# Patient Record
Sex: Male | Born: 1965
Health system: Southern US, Community
[De-identification: ages and names within clinical notes are randomized; demographics above are authoritative.]

## PROBLEM LIST (undated history)

## (undated) DIAGNOSIS — G4730 Sleep apnea, unspecified: Secondary | ICD-10-CM

## (undated) DIAGNOSIS — I10 Essential (primary) hypertension: Secondary | ICD-10-CM

## (undated) HISTORY — PX: EYE SURGERY: SHX253

---

## 2007-03-20 ENCOUNTER — Emergency Department (HOSPITAL_COMMUNITY): Admission: EM | Admit: 2007-03-20 | Discharge: 2007-03-20 | Payer: Self-pay | Admitting: Emergency Medicine

## 2007-03-22 ENCOUNTER — Emergency Department (HOSPITAL_COMMUNITY): Admission: EM | Admit: 2007-03-22 | Discharge: 2007-03-23 | Payer: Self-pay | Admitting: Emergency Medicine

## 2008-05-30 ENCOUNTER — Emergency Department (HOSPITAL_COMMUNITY): Admission: EM | Admit: 2008-05-30 | Discharge: 2008-05-30 | Payer: Self-pay | Admitting: Emergency Medicine

## 2008-07-13 ENCOUNTER — Emergency Department (HOSPITAL_COMMUNITY): Admission: EM | Admit: 2008-07-13 | Discharge: 2008-07-14 | Payer: Self-pay | Admitting: Emergency Medicine

## 2010-04-01 ENCOUNTER — Emergency Department (HOSPITAL_COMMUNITY)
Admission: EM | Admit: 2010-04-01 | Discharge: 2010-04-01 | Payer: Self-pay | Source: Home / Self Care | Admitting: Family Medicine

## 2010-04-04 ENCOUNTER — Emergency Department (HOSPITAL_COMMUNITY)
Admission: EM | Admit: 2010-04-04 | Discharge: 2010-04-04 | Payer: Self-pay | Source: Home / Self Care | Admitting: Family Medicine

## 2010-04-07 LAB — CULTURE, ROUTINE-ABSCESS

## 2010-06-11 ENCOUNTER — Emergency Department (HOSPITAL_COMMUNITY)
Admission: EM | Admit: 2010-06-11 | Discharge: 2010-06-11 | Disposition: A | Discharge status: Home / Self Care | Payer: Worker's Compensation | Attending: Emergency Medicine | Admitting: Emergency Medicine

## 2010-06-11 DIAGNOSIS — I10 Essential (primary) hypertension: Secondary | ICD-10-CM | POA: Insufficient documentation

## 2010-06-11 DIAGNOSIS — Y990 Civilian activity done for income or pay: Secondary | ICD-10-CM | POA: Insufficient documentation

## 2010-06-11 DIAGNOSIS — T25229A Burn of second degree of unspecified foot, initial encounter: Secondary | ICD-10-CM | POA: Insufficient documentation

## 2010-06-11 DIAGNOSIS — X12XXXA Contact with other hot fluids, initial encounter: Secondary | ICD-10-CM | POA: Insufficient documentation

## 2010-06-11 DIAGNOSIS — X131XXA Other contact with steam and other hot vapors, initial encounter: Secondary | ICD-10-CM | POA: Insufficient documentation

## 2010-06-14 ENCOUNTER — Emergency Department (HOSPITAL_COMMUNITY)
Admission: EM | Admit: 2010-06-14 | Discharge: 2010-06-14 | Disposition: A | Discharge status: Home / Self Care | Payer: Worker's Compensation | Attending: Emergency Medicine | Admitting: Emergency Medicine

## 2010-06-14 DIAGNOSIS — I10 Essential (primary) hypertension: Secondary | ICD-10-CM | POA: Insufficient documentation

## 2010-06-14 DIAGNOSIS — Z09 Encounter for follow-up examination after completed treatment for conditions other than malignant neoplasm: Secondary | ICD-10-CM | POA: Insufficient documentation

## 2010-06-14 DIAGNOSIS — X12XXXA Contact with other hot fluids, initial encounter: Secondary | ICD-10-CM | POA: Insufficient documentation

## 2010-06-14 DIAGNOSIS — Y990 Civilian activity done for income or pay: Secondary | ICD-10-CM | POA: Insufficient documentation

## 2010-06-14 DIAGNOSIS — T25219A Burn of second degree of unspecified ankle, initial encounter: Secondary | ICD-10-CM | POA: Insufficient documentation

## 2010-06-17 ENCOUNTER — Inpatient Hospital Stay (INDEPENDENT_AMBULATORY_CARE_PROVIDER_SITE_OTHER)
Admission: RE | Admit: 2010-06-17 | Discharge: 2010-06-17 | Disposition: A | Discharge status: Home / Self Care | Payer: Worker's Compensation | Source: Ambulatory Visit | Attending: Emergency Medicine | Admitting: Emergency Medicine

## 2010-06-17 DIAGNOSIS — I10 Essential (primary) hypertension: Secondary | ICD-10-CM

## 2010-06-17 DIAGNOSIS — T25019A Burn of unspecified degree of unspecified ankle, initial encounter: Secondary | ICD-10-CM

## 2010-06-30 LAB — URINALYSIS, ROUTINE W REFLEX MICROSCOPIC
Bilirubin Urine: NEGATIVE
Glucose, UA: NEGATIVE mg/dL
Hgb urine dipstick: NEGATIVE
Ketones, ur: NEGATIVE mg/dL
Nitrite: NEGATIVE
Protein, ur: NEGATIVE mg/dL
Specific Gravity, Urine: 1.02 (ref 1.005–1.030)
Urobilinogen, UA: 0.2 mg/dL (ref 0.0–1.0)
pH: 6 (ref 5.0–8.0)

## 2010-06-30 LAB — DIFFERENTIAL
Basophils Absolute: 0 10*3/uL (ref 0.0–0.1)
Basophils Relative: 1 % (ref 0–1)
Eosinophils Absolute: 0.1 10*3/uL (ref 0.0–0.7)
Eosinophils Relative: 3 % (ref 0–5)
Lymphocytes Relative: 40 % (ref 12–46)
Lymphs Abs: 2.1 10*3/uL (ref 0.7–4.0)
Monocytes Absolute: 0.5 10*3/uL (ref 0.1–1.0)
Monocytes Relative: 9 % (ref 3–12)
Neutro Abs: 2.6 10*3/uL (ref 1.7–7.7)
Neutrophils Relative %: 48 % (ref 43–77)

## 2010-06-30 LAB — CBC
HCT: 43.8 % (ref 39.0–52.0)
Hemoglobin: 15.2 g/dL (ref 13.0–17.0)
MCHC: 34.7 g/dL (ref 30.0–36.0)
MCV: 95 fL (ref 78.0–100.0)
Platelets: 245 10*3/uL (ref 150–400)
RBC: 4.61 MIL/uL (ref 4.22–5.81)
RDW: 13 % (ref 11.5–15.5)
WBC: 5.4 10*3/uL (ref 4.0–10.5)

## 2010-06-30 LAB — POCT I-STAT, CHEM 8
BUN: 9 mg/dL (ref 6–23)
Calcium, Ion: 1.15 mmol/L (ref 1.12–1.32)
Chloride: 101 mEq/L (ref 96–112)
Creatinine, Ser: 1.4 mg/dL (ref 0.4–1.5)
Glucose, Bld: 89 mg/dL (ref 70–99)
HCT: 45 % (ref 39.0–52.0)
Hemoglobin: 15.3 g/dL (ref 13.0–17.0)
Potassium: 3.5 mEq/L (ref 3.5–5.1)
Sodium: 141 mEq/L (ref 135–145)
TCO2: 31 mmol/L (ref 0–100)

## 2010-06-30 LAB — URINE MICROSCOPIC-ADD ON

## 2010-09-17 ENCOUNTER — Emergency Department (HOSPITAL_COMMUNITY)
Admission: EM | Admit: 2010-09-17 | Discharge: 2010-09-17 | Disposition: A | Discharge status: Home / Self Care | Payer: Self-pay | Attending: Emergency Medicine | Admitting: Emergency Medicine

## 2010-09-17 DIAGNOSIS — I10 Essential (primary) hypertension: Secondary | ICD-10-CM | POA: Insufficient documentation

## 2010-09-17 DIAGNOSIS — M79609 Pain in unspecified limb: Secondary | ICD-10-CM | POA: Insufficient documentation

## 2010-09-17 DIAGNOSIS — L02519 Cutaneous abscess of unspecified hand: Secondary | ICD-10-CM | POA: Insufficient documentation

## 2010-09-17 DIAGNOSIS — M65839 Other synovitis and tenosynovitis, unspecified forearm: Secondary | ICD-10-CM | POA: Insufficient documentation

## 2010-09-17 DIAGNOSIS — L03019 Cellulitis of unspecified finger: Secondary | ICD-10-CM | POA: Insufficient documentation

## 2010-09-17 LAB — GRAM STAIN

## 2010-09-17 LAB — CBC
HCT: 37.7 % — ABNORMAL LOW (ref 39.0–52.0)
Hemoglobin: 13.5 g/dL (ref 13.0–17.0)
MCH: 31.8 pg (ref 26.0–34.0)
MCHC: 35.8 g/dL (ref 30.0–36.0)
MCV: 88.9 fL (ref 78.0–100.0)
Platelets: 278 10*3/uL (ref 150–400)
RBC: 4.24 MIL/uL (ref 4.22–5.81)
RDW: 12.6 % (ref 11.5–15.5)
WBC: 9 10*3/uL (ref 4.0–10.5)

## 2010-09-17 LAB — DIFFERENTIAL
Basophils Absolute: 0 10*3/uL (ref 0.0–0.1)
Basophils Relative: 0 % (ref 0–1)
Eosinophils Absolute: 0.1 10*3/uL (ref 0.0–0.7)
Eosinophils Relative: 1 % (ref 0–5)
Lymphocytes Relative: 33 % (ref 12–46)
Lymphs Abs: 3 10*3/uL (ref 0.7–4.0)
Monocytes Absolute: 0.9 10*3/uL (ref 0.1–1.0)
Monocytes Relative: 10 % (ref 3–12)
Neutro Abs: 5.1 10*3/uL (ref 1.7–7.7)
Neutrophils Relative %: 56 % (ref 43–77)

## 2010-09-19 LAB — CULTURE, ROUTINE-ABSCESS

## 2010-09-20 NOTE — Op Note (Signed)
  NAME:  Xavier Norman, Xavier Norman NO.:  [id number]  MEDICAL RECORD NO.:  [id number]  LOCATION:  [facility]                         FACILITY:  [facility]  PHYSICIAN:  Johnette Abraham, MD    DATE OF BIRTH:  1966/02/12  DATE OF PROCEDURE:  09/17/2010 DATE OF DISCHARGE:                              OPERATIVE REPORT   PREOPERATIVE DIAGNOSIS:  Infection and early tenosynovitis of the right long finger.  POSTOPERATIVE DIAGNOSIS:  Infection and early tenosynovitis of the right long finger.  PROCEDURE:  Incision and drainage of right long finger.  Irrigation of the tendon sheath of the right long finger, packing with Iodoform gauze.  INDICATIONS:  [name] is a 45 year old black male who noticed approximately 3 days ago blister on the aspect of his right long finger. He applied Neosporin and a Band-Aid and since that time it has become progressively red, swollen, tender, now it hurts to bend his fingers having some pain radiating to his palm.  He was evaluated in the emergency department and the diagnosis of early tenosynovitis and abscess was made.  Consent was made for incision and drainage of the index finger.  All risks, benefits, and alternatives were discussed with the patient.  He agreed to proceed.  The patient received IV clindamycin in the emergency room.  PROCEDURE:  The patient was taken to the operating room, placed supine on the operating room table.  The general anesthesia was administered without difficulty.  Time-out was performed identifying the correct finger.  The hand was prepped and draped in the normal sterile fashion. The arm was elevated.  Tourniquet was used and placed to 250 mmHg.  An incision overlying the area of maximal fluctuance on the on the ulnar aspect of the long finger just overlying the middle phalanx was made.  A V-incision was made over the middle phalanx and onto the distal phalanx. Immediately upon incising the skin, there was some purulence.   This was sent for culture.  The wound was then enlarged and exploration was performed.  There seemed to be an abscess that tracked along the neurovascular bundle and central pulp of the digit along the mid and distal phalanx.  This was evacuated.  Skin and subcutaneous tissue was debrided.  The wound was then thoroughly irrigated.  The flexor tendon sheath was opened milking from a proximal to distal direction revealed some possible turbid fluid.  Again, this whole area including the sheath was irrigated with irrigation solution.  Tourniquet was then released. Hemostasis was achieved with bipolar.  The wound was partially closed with interrupted 5-0 nylon and a piece of Iodoform gauze was placed in the wound for drainage and antimicrobial effect.  The patient tolerated the procedure well and was taken to recovery room in stable condition. Estimated blood loss minimal.  No acute complications.     Johnette Abraham, MD    HCC/MEDQ  D:  09/17/2010  T:  09/17/2010  Job:  [id number]  Electronically Signed by Knute Neu MD on 09/20/2010 08:53:59 AM

## 2010-09-22 LAB — ANAEROBIC CULTURE

## 2010-11-20 ENCOUNTER — Inpatient Hospital Stay (HOSPITAL_COMMUNITY)
Admission: EM | Admit: 2010-11-20 | Discharge: 2010-11-25 | DRG: 918 | Disposition: A | Discharge status: Home / Self Care | Payer: Self-pay | Attending: Internal Medicine | Admitting: Internal Medicine

## 2010-11-20 ENCOUNTER — Emergency Department (HOSPITAL_COMMUNITY): Payer: Self-pay

## 2010-11-20 DIAGNOSIS — N183 Chronic kidney disease, stage 3 unspecified: Secondary | ICD-10-CM | POA: Diagnosis present

## 2010-11-20 DIAGNOSIS — M6282 Rhabdomyolysis: Secondary | ICD-10-CM | POA: Diagnosis present

## 2010-11-20 DIAGNOSIS — I129 Hypertensive chronic kidney disease with stage 1 through stage 4 chronic kidney disease, or unspecified chronic kidney disease: Secondary | ICD-10-CM | POA: Diagnosis present

## 2010-11-20 DIAGNOSIS — T405X1A Poisoning by cocaine, accidental (unintentional), initial encounter: Principal | ICD-10-CM | POA: Diagnosis present

## 2010-11-20 DIAGNOSIS — R079 Chest pain, unspecified: Secondary | ICD-10-CM | POA: Diagnosis present

## 2010-11-20 DIAGNOSIS — R000 Tachycardia, unspecified: Secondary | ICD-10-CM | POA: Diagnosis present

## 2010-11-20 DIAGNOSIS — T43601A Poisoning by unspecified psychostimulants, accidental (unintentional), initial encounter: Secondary | ICD-10-CM | POA: Diagnosis present

## 2010-11-20 DIAGNOSIS — F1410 Cocaine abuse, uncomplicated: Secondary | ICD-10-CM | POA: Diagnosis present

## 2010-11-20 LAB — CK TOTAL AND CKMB (NOT AT ARMC)
CK, MB: 5.5 ng/mL — ABNORMAL HIGH (ref 0.3–4.0)
CK, MB: 5 ng/mL — ABNORMAL HIGH (ref 0.3–4.0)
Relative Index: 1.2 (ref 0.0–2.5)
Relative Index: 1.1 (ref 0.0–2.5)
Total CK: 447 U/L — ABNORMAL HIGH (ref 7–232)
Total CK: 454 U/L — ABNORMAL HIGH (ref 7–232)

## 2010-11-20 LAB — RAPID URINE DRUG SCREEN, HOSP PERFORMED
Amphetamines: NOT DETECTED
Barbiturates: NOT DETECTED
Benzodiazepines: POSITIVE — AB
Cocaine: POSITIVE — AB
Opiates: NOT DETECTED
Tetrahydrocannabinol: NOT DETECTED

## 2010-11-20 LAB — COMPREHENSIVE METABOLIC PANEL
ALT: 27 U/L (ref 0–53)
AST: 26 U/L (ref 0–37)
AST: 27 U/L (ref 0–37)
Albumin: 4 g/dL (ref 3.5–5.2)
Albumin: 4 g/dL (ref 3.5–5.2)
Alkaline Phosphatase: 56 U/L (ref 39–117)
BUN: 12 mg/dL (ref 6–23)
CO2: 25 mEq/L (ref 19–32)
Calcium: 9.5 mg/dL (ref 8.4–10.5)
Calcium: 9.3 mg/dL (ref 8.4–10.5)
Chloride: 108 mEq/L (ref 96–112)
Creatinine, Ser: 1.42 mg/dL — ABNORMAL HIGH (ref 0.50–1.35)
Creatinine, Ser: 1.49 mg/dL — ABNORMAL HIGH (ref 0.50–1.35)
GFR calc Af Amer: >60 mL/min (ref >60)
GFR calc non Af Amer: 54 mL/min — ABNORMAL LOW (ref >60)
Glucose, Bld: 122 mg/dL — ABNORMAL HIGH (ref 70–99)
Potassium: 3.6 mEq/L (ref 3.5–5.1)
Sodium: 144 mEq/L (ref 135–145)
Total Bilirubin: 0.4 mg/dL (ref 0.3–1.2)
Total Protein: 7.6 g/dL (ref 6.0–8.3)

## 2010-11-20 LAB — POCT I-STAT, CHEM 8
BUN: 10 mg/dL (ref 6–23)
Calcium, Ion: 1.15 mmol/L (ref 1.12–1.32)
Chloride: 108 meq/L (ref 96–112)
Creatinine, Ser: 1.5 mg/dL — ABNORMAL HIGH (ref 0.50–1.35)
Glucose, Bld: 126 mg/dL — ABNORMAL HIGH (ref 70–99)
HCT: 42 % (ref 39.0–52.0)
Hemoglobin: 14.3 g/dL (ref 13.0–17.0)
Potassium: 3.7 meq/L (ref 3.5–5.1)
Sodium: 144 meq/L (ref 135–145)
TCO2: 22 mmol/L (ref 0–100)

## 2010-11-20 LAB — CBC
HCT: 38.6 % — ABNORMAL LOW (ref 39.0–52.0)
Hemoglobin: 14 g/dL (ref 13.0–17.0)
MCH: 31.8 pg (ref 26.0–34.0)
MCHC: 36.3 g/dL — ABNORMAL HIGH (ref 30.0–36.0)
MCV: 87.7 fL (ref 78.0–100.0)
Platelets: 245 10*3/uL (ref 150–400)
RBC: 4.4 MIL/uL (ref 4.22–5.81)
RDW: 12.9 % (ref 11.5–15.5)
WBC: 8.3 10*3/uL (ref 4.0–10.5)

## 2010-11-20 LAB — URINALYSIS, ROUTINE W REFLEX MICROSCOPIC
Bilirubin Urine: NEGATIVE
Glucose, UA: NEGATIVE mg/dL
Ketones, ur: NEGATIVE mg/dL
Leukocytes, UA: NEGATIVE
Nitrite: NEGATIVE
Protein, ur: 100 mg/dL — AB
Specific Gravity, Urine: 1.018 (ref 1.005–1.030)
Urobilinogen, UA: 0.2 mg/dL (ref 0.0–1.0)
pH: 6 (ref 5.0–8.0)

## 2010-11-20 LAB — URINE MICROSCOPIC-ADD ON

## 2010-11-20 LAB — COMPREHENSIVE METABOLIC PANEL WITH GFR
ALT: 29 U/L (ref 0–53)
Alkaline Phosphatase: 57 U/L (ref 39–117)
BUN: 11 mg/dL (ref 6–23)
CO2: 23 meq/L (ref 19–32)
Chloride: 106 meq/L (ref 96–112)
GFR calc Af Amer: >60 mL/min (ref >60)
GFR calc non Af Amer: 51 mL/min — ABNORMAL LOW (ref >60)
Glucose, Bld: 117 mg/dL — ABNORMAL HIGH (ref 70–99)
Potassium: 3.8 meq/L (ref 3.5–5.1)
Sodium: 142 meq/L (ref 135–145)
Total Bilirubin: 0.4 mg/dL (ref 0.3–1.2)
Total Protein: 7.3 g/dL (ref 6.0–8.3)

## 2010-11-20 LAB — DIFFERENTIAL
Basophils Absolute: 0 10*3/uL (ref 0.0–0.1)
Basophils Relative: 0 % (ref 0–1)
Eosinophils Absolute: 0 10*3/uL (ref 0.0–0.7)
Eosinophils Relative: 0 % (ref 0–5)
Lymphocytes Relative: 9 % — ABNORMAL LOW (ref 12–46)
Lymphs Abs: 0.8 10*3/uL (ref 0.7–4.0)
Monocytes Absolute: 0.4 10*3/uL (ref 0.1–1.0)
Monocytes Relative: 5 % (ref 3–12)
Neutro Abs: 7.1 10*3/uL (ref 1.7–7.7)
Neutrophils Relative %: 86 % — ABNORMAL HIGH (ref 43–77)

## 2010-11-20 LAB — ETHANOL: Alcohol, Ethyl (B): <11 mg/dL (ref 0–11)

## 2010-11-20 LAB — POCT I-STAT TROPONIN I
Troponin i, poc: 0.01 ng/mL (ref 0.00–0.08)
Troponin i, poc: 0.02 ng/mL (ref 0.00–0.08)

## 2010-11-21 LAB — CARDIAC PANEL(CRET KIN+CKTOT+MB+TROPI)
CK, MB: 6.8 ng/mL (ref 0.3–4.0)
CK, MB: 6.6 ng/mL (ref 0.3–4.0)
CK, MB: 5.9 ng/mL — ABNORMAL HIGH (ref 0.3–4.0)
Relative Index: 1.2 (ref 0.0–2.5)
Relative Index: 1.1 (ref 0.0–2.5)
Relative Index: 1 (ref 0.0–2.5)
Total CK: 545 U/L — ABNORMAL HIGH (ref 7–232)
Total CK: 613 U/L — ABNORMAL HIGH (ref 7–232)
Total CK: 603 U/L — ABNORMAL HIGH (ref 7–232)
Troponin I: <0.3 ng/mL (ref <0.30)
Troponin I: <0.3 ng/mL (ref <0.30)
Troponin I: <0.3 ng/mL (ref <0.30)

## 2010-11-21 LAB — COMPREHENSIVE METABOLIC PANEL
Albumin: 3.5 g/dL (ref 3.5–5.2)
Alkaline Phosphatase: 50 U/L (ref 39–117)
BUN: 11 mg/dL (ref 6–23)
Chloride: 107 mEq/L (ref 96–112)
Creatinine, Ser: 1.57 mg/dL — ABNORMAL HIGH (ref 0.50–1.35)
GFR calc Af Amer: 58 mL/min — ABNORMAL LOW (ref >60)
GFR calc non Af Amer: 48 mL/min — ABNORMAL LOW (ref >60)
Glucose, Bld: 97 mg/dL (ref 70–99)
Total Bilirubin: 0.6 mg/dL (ref 0.3–1.2)

## 2010-11-21 LAB — CBC
HCT: 37.1 % — ABNORMAL LOW (ref 39.0–52.0)
Hemoglobin: 12.8 g/dL — ABNORMAL LOW (ref 13.0–17.0)
MCH: 31 pg (ref 26.0–34.0)
MCHC: 34.5 g/dL (ref 30.0–36.0)
MCV: 89.8 fL (ref 78.0–100.0)
Platelets: 225 K/uL (ref 150–400)
RBC: 4.13 MIL/uL — ABNORMAL LOW (ref 4.22–5.81)
RDW: 13.2 % (ref 11.5–15.5)
WBC: 6.6 10*3/uL (ref 4.0–10.5)

## 2010-11-21 LAB — COMPREHENSIVE METABOLIC PANEL WITH GFR
ALT: 24 U/L (ref 0–53)
AST: 26 U/L (ref 0–37)
CO2: 26 meq/L (ref 19–32)
Calcium: 8.5 mg/dL (ref 8.4–10.5)
Potassium: 3 meq/L — ABNORMAL LOW (ref 3.5–5.1)
Sodium: 143 meq/L (ref 135–145)
Total Protein: 6.7 g/dL (ref 6.0–8.3)

## 2010-11-22 DIAGNOSIS — R079 Chest pain, unspecified: Secondary | ICD-10-CM

## 2010-11-22 LAB — BASIC METABOLIC PANEL
CO2: 26 mEq/L (ref 19–32)
Chloride: 107 mEq/L (ref 96–112)
Glucose, Bld: 94 mg/dL (ref 70–99)
Potassium: 3.4 mEq/L — ABNORMAL LOW (ref 3.5–5.1)
Sodium: 141 mEq/L (ref 135–145)

## 2010-11-22 LAB — CK TOTAL AND CKMB (NOT AT ARMC)
CK, MB: 3.8 ng/mL (ref 0.3–4.0)
Relative Index: 0.8 (ref 0.0–2.5)
Total CK: 463 U/L — ABNORMAL HIGH (ref 7–232)

## 2010-11-22 NOTE — H&P (Signed)
NAME:  Xavier Norman, Xavier Norman NO.:  [id number]  MEDICAL RECORD NO.:  [id number]  LOCATION:  [facility]                         FACILITY:  [facility]  PHYSICIAN:  Marinda Elk, M.D.DATE OF BIRTH:  Mar 11, 1966  DATE OF ADMISSION:  11/20/2010 DATE OF DISCHARGE:                             HISTORY & PHYSICAL   PRIMARY CARE DOCTOR:  [facility].  CHIEF COMPLAINT:  Unintentional cocaine overdose.  HISTORY OF PRESENT ILLNESS:  This is a 45 year old with no significant past medical history who was brought here by the police.  As per his EMS report, he was feeling tired and his chest hurting.  When asked why, apparently he was running from the police.  He reported that they were charging him with marijuana and cocaine possession.  He relates he swallowed about 2 to 3 bags of cocaine.  He indicates it was a large bags.  In the ED here, he was given charcoal.  He has had several bowel movements.  His blood pressure is better controlled now but he continues to have tachycardia.  So we were consulted to admit and further evaluate.  ALLERGIES:  No known drug allergies.  PAST MEDICAL HISTORY:  Hypertension.  MEDICATION:  He is on a water pill; he does not know which one.  FAMILY HISTORY:  His mother died of a ruptured thoracic aortic aneurysm. His father died at age 79 of stomach cancer.  SOCIAL HISTORY:  He denies smoking.  Admits to marijuana, cocaine and alcohol.  Lives here in [place].  REVIEW OF SYSTEMS:  Ten-point review of systems done, pertinent positives per HPI.  PHYSICAL EXAM:  INITIAL VITAL SIGNS:  Temperature 98, pulse of 142, blood pressure 136/82.  He was breathing 16 times per minute, saturating 95% on room air.  Repeated heart rate started to decrease at 134, 120. GENERAL:  He is awake, alert and oriented x3, coherent and fluent language, in no acute distress. HEENT:  Dry mucous membranes.  Anicteric.  No pallor. NECK:  No JVD, no bruits, no  thyromegaly. CARDIOVASCULAR:  Tachycardic with a regular rate and rhythm with positive S1 and S2.  No murmurs, rubs or gallops.  LUNGS:  Good air movement, clear to auscultation. ABDOMEN:  Positive bowel sounds.  Abdomen nontender, nondistended, soft. EXTREMITIES:  Positive pulses.  No clubbing, cyanosis or edema. SKIN:  No rash or ulceration, has multiple tattoos. NEUROLOGIC:  Nonfocal.  LABS ON ADMISSION:  Sodium 144, potassium 3.7, chloride 108, glucose of 136, BUN of 10, creatinine 1.5.  His first set of cardiac enzymes is negative x1.  His sodium is 144, potassium 3.6, chloride 108, bicarb 25, glucose of 122, BUN of 12, creatinine 1.4.  LFTs were within normal limits.  Alcohol level was less than 1.  His white count is 8.3, hemoglobin of 14, platelet count 245.  An EKG which is pending at this time of the dictation.  The ED did not order it.  ASSESSMENT AND PLAN:  Unintentional cocaine overdose of body-stuffer.  I will admit him to telemetry for 23-hour observation.  We will use Ativan p.r.n.  Will start him on Norvasc for blood pressure control.  Also use Ativan for increased tachycardia and increasing blood pressure.  At this time, he is chest pain free, no shortness of breath.  So within 23 hours, he could be discharged home.  Hypertension:  Does not remember medications he is taking.  We will start him on Norvasc.  This for his cocaine abuse and will continue to monitor his blood pressure.     Marinda Elk, M.D.     AF/MEDQ  D:  11/20/2010  T:  11/20/2010  Job:  [id number]  cc:   [facility] Fax: [phone]  Electronically Signed by Marinda Elk M.D. on 11/22/2010 07:00:50 AM

## 2010-11-23 ENCOUNTER — Observation Stay (HOSPITAL_COMMUNITY): Payer: Self-pay

## 2010-11-23 ENCOUNTER — Other Ambulatory Visit (HOSPITAL_COMMUNITY): Payer: Self-pay

## 2010-11-23 LAB — MRSA PCR SCREENING
MRSA by PCR: INVALID — AB
MRSA by PCR: POSITIVE — AB

## 2010-11-23 LAB — CK: Total CK: 280 U/L — ABNORMAL HIGH (ref 7–232)

## 2010-11-24 ENCOUNTER — Ambulatory Visit (HOSPITAL_COMMUNITY): Payer: Worker's Compensation | Attending: Cardiology

## 2010-11-24 ENCOUNTER — Encounter (HOSPITAL_COMMUNITY): Payer: Worker's Compensation | Attending: Cardiology

## 2010-11-24 ENCOUNTER — Ambulatory Visit (HOSPITAL_COMMUNITY): Payer: Worker's Compensation

## 2010-11-24 DIAGNOSIS — I4949 Other premature depolarization: Secondary | ICD-10-CM | POA: Insufficient documentation

## 2010-11-24 DIAGNOSIS — I10 Essential (primary) hypertension: Secondary | ICD-10-CM | POA: Insufficient documentation

## 2010-11-24 DIAGNOSIS — I491 Atrial premature depolarization: Secondary | ICD-10-CM | POA: Insufficient documentation

## 2010-11-24 DIAGNOSIS — R079 Chest pain, unspecified: Secondary | ICD-10-CM | POA: Insufficient documentation

## 2010-11-24 MED ORDER — TECHNETIUM TC 99M TETROFOSMIN IV KIT
10.0000 | PACK | Freq: Once | INTRAVENOUS | Status: AC | PRN
  Administered 2010-11-24: 10 via INTRAVENOUS

## 2010-11-24 MED ORDER — TECHNETIUM TC 99M TETROFOSMIN IV KIT
30.0000 | PACK | Freq: Once | INTRAVENOUS | Status: AC | PRN
  Administered 2010-11-24: 30 via INTRAVENOUS

## 2010-11-25 ENCOUNTER — Ambulatory Visit (HOSPITAL_COMMUNITY)
Admission: AD | Admit: 2010-11-25 | Discharge: 2010-11-25 | Disposition: A | Discharge status: Home / Self Care | Payer: Worker's Compensation | Source: Ambulatory Visit | Attending: Cardiology | Admitting: Cardiology

## 2010-11-25 DIAGNOSIS — R0789 Other chest pain: Secondary | ICD-10-CM | POA: Insufficient documentation

## 2010-11-25 LAB — BASIC METABOLIC PANEL
BUN: 15 mg/dL (ref 6–23)
Creatinine, Ser: 1.34 mg/dL (ref 0.50–1.35)
GFR calc Af Amer: >60 mL/min (ref >60)
GFR calc non Af Amer: 58 mL/min — ABNORMAL LOW (ref >60)
Glucose, Bld: 104 mg/dL — ABNORMAL HIGH (ref 70–99)
Potassium: 3.6 mEq/L (ref 3.5–5.1)

## 2010-11-25 NOTE — Discharge Summary (Addendum)
NAME:  EPIC, TRIBBETT NO.:  [id number]  MEDICAL RECORD NO.:  [id number]  LOCATION:  1401                         FACILITY:  [facility]  PHYSICIAN:  Andreas Blower, MD       DATE OF BIRTH:  05/10/1965  DATE OF ADMISSION:  11/20/2010 DATE OF DISCHARGE:  11/24/2010                        DISCHARGE SUMMARY - REFERRING   PRIMARY CARE PROVIDER:  Unassigned at this time.  DISCHARGE DIAGNOSES: 1. Chest pain, atypical. 2. Hypertension, uncontrolled. 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 4. Cocaine abuse. 5. Mild rhabdomyolysis.  DISCHARGE MEDICATIONS: 1. Amlodipine 5 mg p.o. daily. 2. Diltiazem 240 mg CD/ER p.o. daily. 3. Hydrochlorothiazide 12.5 mg p.o. daily.  DIAGNOSTIC LABORATORIES:  Sodium 144, potassium 3.6, chloride 108, CO2 25, BUN 12, creatinine 1.5, glucose 122.  First set of cardiac enzymes; total CK 447, CK-MB 5.5, troponin I 0.01.  Second set of cardiac enzymes; total CK 554, CK-MB 5.0, troponin I 0.02.  Third set; total CK 545, CK-MB 6.8, troponin I less than 0.30.  Fourth set; total CK 613, CK- MB 6.6, troponin I less than 0.30.  Last set; total CK 603, CK-MB 5.9, troponin I less than 0.30.  Total CK on September 4th is 280.  Urine drug screen was positive for benzos and cocaine.  Urinalysis yielded trace protein, trace blood, 7-10 RBCs.  MRSA PCR screening was positive.  DIAGNOSTIC RADIOLOGY: 1. Abdominal x-ray on September 1st yields no acute cardiopulmonary     disease.  Nonobstructive bowel gas pattern. 2. Nuclear med myocardial on September 4th, results pending at the     time of this dictation.  To be dictated as an addendum.  CONSULTS:  Gerrit Friends. Dietrich Pates, MD, [facility] on September 3rd from Cardiology.  PROCEDURES:  Myoview over at [facility] on September 5th.  Results pending at the time of this dictation.  BRIEF HISTORY:  Mr. Francom is a 45 year old male with a past medical history of hypertension and chronic kidney disease who presented  to the [facility] ED via the police department.  Per EMS, he stated that he was having chest pain and feeling tired.  He reported that he was running from the police and that they were charging him with marijuana and cocaine possession.  He reported that he swallowed 2 to 3 bags of cocaine.  He was given charcoal in the emergency room.  He had several bowel movements.  His blood pressure was better controlled, but continued to have tachycardia, so [facility] were asked to admit for further evaluation.  HOSPITAL COURSE BY PROBLEM: 1. Chest pain, atypical.  The patient was admitted to Telemetry.  His     cardiac enzymes are as dictated above.  Troponins negative x3.     Highest CK was 613.  Since he reported that he had swallowed bags     of cocaine, Poison Control was consulted and confirmed the     treatment plan.  Specifically to monitor his cardiac enzymes.  The     patient's chest pain improved during his hospitalization.     Cardiology consulted on September 3rd.  Impression was chest     discomfort proceeded ingestion of cocaine bag presumably and has  the qualities of musculoskeletal pain.  It is doubtful that it     represents myocardial ischemia.  Even so, the patient underwent a     Myoview stress test.  Results are pending at the time of this     dictation.  Chest pain is resolved. 2. Hypertension, poorly controlled.  The patient was started on HCTZ,     diltiazem, and amlodipine.  Would avoid beta blockers given his     history of cocaine use.  The patient will need to follow up with     his primary care provider for better blood pressure control. 3. Chronic kidney disease, stage 2 to 3 probably secondary to     hypertension.  At the time of admission, creatinine was 1.5.  The     patient was gently hydrated.  Nephrotoxins were held.  At the time     of discharge, his creatinine is at baseline which according to his     chart review is 1.4. 4. Cocaine use.  The  patient was seen by substance abuse counselor. 5. Rhabdomyolysis, improving.  PHYSICAL EXAMINATION:  VITAL SIGNS: Temperature 98.4, blood pressure 171/96, heart rate 60, respirations 16, sats 97% on room air. GENERAL:  Awake, alert, no acute distress. CV: Regular rate and rhythm.  No murmur, gallop, or rub.  No lower extremity edema. RESPIRATORY:  Normal effort.  Breath sounds clear to auscultation bilaterally. ABDOMEN:  Round, soft, positive bowel sounds throughout, nontender to palpation. NEURO:  Alert and oriented x3.  Speech clear.  Facial symmetry.  FOLLOWUP:  The patient is being set up with primary care provider at [facility] and offered assistance for his antihypertensive medications. He will need to follow up with his primary care provider in 1-2 weeks to establish patient care.  DISPOSITION:  At the time of this dictation, the patient is medically stable and ready for discharge to home once results of his Myoview are confirmed.  Dr. Betti Cruz will call for those preliminary reports and discharge later this afternoon.  Time spent on this dictation 35 minutes.   ADDENDUM: 11/25/2010 Patient's stress test was positive and as a result patient's discharge was delayed as the patient had a cardiac cath on 11/25/2010 which showed nonobstructive coronary arterites. Patient discharged home today with antihypertenstive medications.  Time spent on discharge, talking to the patient, coordinating care, was 25 mins.   Gwenyth Bender, NP   ______________________________ Andreas Blower, MD    KMB/MEDQ  D:  11/24/2010  T:  11/24/2010  Job:  [id number]  Electronically Signed by Wardell Heath REDDY  on 11/25/2010 08:05:16 PM Electronically Signed by Toya Smothers  on 12/09/2010 07:54:02 AM

## 2010-12-04 NOTE — Discharge Summary (Addendum)
  NAME:  DASHTON, CZERWINSKI NO.:  [id number]  MEDICAL RECORD NO.:  [id number]  LOCATION:  1401                         FACILITY:  [facility]  PHYSICIAN:  Marca Ancona, MD      DATE OF BIRTH:  [birth year]  DATE OF ADMISSION:  11/20/2010 DATE OF DISCHARGE:  11/25/2010                              DISCHARGE SUMMARY   ADDENDUM: Prior to discharge from the [facility] Cath Lab, the patient's medications were reviewed.  It was noted he is on 2 calcium channel blockers and therefore, we have discontinued his Norvasc and his home medications are now simply diltiazem CD 360 daily and hydrochlorothiazide 12.5 mg daily.     Nicolasa Ducking, ANP   ______________________________ Marca Ancona, MD    CB/MEDQ  D:  11/25/2010  T:  11/26/2010  Job:  [id number]  Electronically Signed by Nicolasa Ducking ANP on 12/02/2010 03:21:12 PM Electronically Signed by Marca Ancona MD on 12/04/2010 11:21:31 PM Electronically Signed by Marca Ancona MD on 12/04/2010 11:21:37 PM

## 2010-12-04 NOTE — Discharge Summary (Signed)
  NAME:  Xavier Norman, Xavier Norman NO.:  [id number]  MEDICAL RECORD NO.:  [id number]  LOCATION:  CATH                         FACILITY:  [facility]  PHYSICIAN:  Marca Ancona, MD      DATE OF BIRTH:  1965/07/14  DATE OF ADMISSION:  11/25/2010 DATE OF DISCHARGE:                              DISCHARGE SUMMARY   ADDENDUM  The patient underwent stress testing on November 24, 2010, revealing small inferior lateral wall stress-induced decreased uptake without wall motion abnormalities suspicious for inferior lateral wall inducible ischemia.  EF was calculated at 57%.  This was reviewed by Dr. [place] and it was felt that the patient would require diagnostic catheterization, which took place earlier today showing normal coronary arteries.  We have discussed the patient's case with the admitting physician at [facility], and the patient will be discharged from the [facility] cath lab this evening.  There are no changes to his previously dictated discharge medications.     Nicolasa Ducking, ANP   ______________________________ Marca Ancona, MD    CB/MEDQ  D:  11/25/2010  T:  11/26/2010  Job:  [id number]  Electronically Signed by Nicolasa Ducking ANP on 12/02/2010 03:21:38 PM Electronically Signed by Marca Ancona MD on 12/04/2010 11:21:45 PM

## 2010-12-04 NOTE — Cardiovascular Report (Signed)
  NAME:  Xavier Norman, Xavier Norman NO.:  [id number]  MEDICAL RECORD NO.:  [id number]  LOCATION:  CATH                         FACILITY:  [facility]  PHYSICIAN:  Marca Ancona, MD      DATE OF BIRTH:  Apr 24, 1965  DATE OF PROCEDURE:  11/25/2010 DATE OF DISCHARGE:                           CARDIAC CATHETERIZATION   PROCEDURE: 1. Left heart catheterization. 2. Coronary angiography. 3. Left ventriculography.  INDICATIONS:  This is a 45 year old with chest pain in the setting of cocaine use and abnormal Myoview.  PROCEDURE NOTE:  After informed consent was obtained, the patient underwent Allen testing on the right wrist.  The right ulnar artery gave good collateral circulation to the radial side of the hand, constituting a positive Allen test.  Therefore, the right wrist was sterilely prepped and draped with 1% Lidocaine which is locally anesthetize the right radial area.  The right radial artery was then entered using modified Seldinger technique and a 5-French arterial sheath was placed.  The patient received 3 mg intra-arterial verapamil and 4000 units of IV heparin.  The right coronary artery was engaged using the JL-4 catheter. Left coronary artery was engaged using the JL-3.5 catheter and left ventricle was engaged using angled-pigtail catheter.  There were no complications.  FINDINGS: 1. Hemodynamics aorta 151/94, LV 148/18. 2. Left ventriculography.  EF was 60% with normal wall motion in the     RAO projection. 3. Right coronary artery.  The right coronary artery was a dominant     vessel.  There was no angiographic coronary disease. 4. Left main.  Left main had no angiographic coronary disease. 5. Left circumflex system.  Left circumflex system had no angiographic     coronary disease. 6. LAD system.  Initially, there was slow flow down the LAD that could     be consistent with microvascular dysfunction; however, as the case     proceeded, flow normalized down the  LAD.  There was no angiographic     coronary disease.  IMPRESSION:  The patient has noncardiac chest pain.  Most likely, there is some microvascular dysfunction given the slow flow initially down the LAD and that had improved by the end of the case. There is no angiographic coronary disease.  We will plan on discharging the patient home today.    Marca Ancona, MD    DM/MEDQ  D:  11/25/2010  T:  11/25/2010  Job:  [id number]  Electronically Signed by Marca Ancona MD on 12/04/2010 11:23:54 PM

## 2010-12-09 LAB — URINALYSIS, ROUTINE W REFLEX MICROSCOPIC
Glucose, UA: NEGATIVE
Hgb urine dipstick: NEGATIVE
Ketones, ur: NEGATIVE
Leukocytes, UA: NEGATIVE
Protein, ur: NEGATIVE
pH: 6.5

## 2010-12-22 NOTE — Consult Note (Signed)
NAME:  Xavier Norman, NORRINGTON NO.:  [id number]  MEDICAL RECORD NO.:  [id number]  LOCATION:  1401                         FACILITY:  [facility]  PHYSICIAN:  Gerrit Friends. Dietrich Pates, MD, FACCDATE OF BIRTH:  11-May-1965  DATE OF CONSULTATION:  11/22/2010 DATE OF DISCHARGE:                                CONSULTATION   REFERRING PHYSICIAN:  Jeoffrey Massed, MD  PRIMARY CARE:  [facility].  HISTORY OF PRESENT ILLNESS:  A 45 year old gentleman admitted with a cocaine overdose, related to ingestion of drug in an attempt to avoid detection by the police.  He reports a 1-week history of constant anterior chest discomfort with a pulling quality.  There is no associated dyspnea nor diaphoresis.  He has had some intermittent nausea.  There is no relationship to exertion.  Pain is worsened with deep inspiration or with movement of the trunk.  At presentation, he had the tachycardia that is now resolved.  His EKGs show inferolateral T wave abnormalities that are fairly minor.  Cardiac markers have been negative for myocardial damage with elevated CPK, but negative MB and troponins.  He has a history of hypertension for which he takes an unknown medication, apparently without adequate control of that process.  He denies the use of tobacco products.  He is unaware of his lipid status.  FAMILY HISTORY:  Mother suffered a fatal rupture of a thoracic aortic aneurysm.  Father died at age 31 due to gastric neoplasm.  SOCIAL HISTORY:  No tobacco use as noted above.  He does consume marijuana, cocaine, and alcohol.  He had no alcohol in his blood on admission, but cocaine was positive.  REVIEW OF SYSTEMS:  Reports headache.  All other systems reviewed and are negative.  PHYSICAL EXAMINATION:  GENERAL:  Appropriate conversant gentleman, in no acute distress who is muscular, but somewhat overweight. VITAL SIGNS:  The blood pressure is 160/96, heart rate 80 and regular, respirations 18,  temperature 98.3, O2 saturation 97% on 2 L. HEENT:  Normal lids and conjunctivae; normal oral mucosa. SKIN:  Tattoo over the left pectoral muscle. PSYCHIATRIC:  Alert and oriented; normal affect. NECK:  No jugular venous distention; normal carotid upstrokes without bruits. ENDOCRINE:  No thyromegaly. HEMATOPOIETIC:  No adenopathy. LUNGS:  Clear. CARDIAC:  Normal first and second heart sounds. ABDOMEN:  Soft and nontender; no organomegaly. NEUROLOGIC:  Symmetric strength and tone; normal cranial nerves. EXTREMITIES:  No edema; normal distal pulses.  LABORATORY DATA:  Otherwise notable for mild elevation of creatinine to as high as 1.57, peak CPK of 613 with an MB of 6.6 or 1%.  Microscopic hematuria.  IMPRESSION:  Mr. Siefring has hypertension that has been adequately controlled at present and likely long-term as well.  He has a headache that is likely due to nitrates, but could also be caused by amlodipine, which will be changed to diltiazem 240 mg daily.  Hydrochlorothiazide will be added as a second antihypertensive agent.  Cocaine should be long gone from Mr. Serano system unless there was very delayed absorption, perhaps related to loss of integrity of one of the bags he swallowed or markedly slowed bowel transit or some other circumstance.  Since his chest discomfort proceeded his ingestion  and has the qualities of musculoskeletal pain, I doubt that this represents myocardial ischemia.  Nonetheless, a stress nuclear study will be performed in the a.m.  I would not treat him with narcotics nor would I increase his agents designed to treat coronary spasm.  Naprosyn will be started to determine if he has a response to that agent.  We would hope that the patient can be discharged if stress test is negative tomorrow.     Gerrit Friends. Dietrich Pates, MD, [facility]     RMR/MEDQ  D:  11/22/2010  T:  11/22/2010  Job:  [id number]  Electronically Signed by Piru Bing MD [facility] on 12/22/2010  10:31:24 AM

## 2010-12-24 LAB — GC/CHLAMYDIA PROBE AMP, GENITAL: Chlamydia, DNA Probe: NEGATIVE

## 2010-12-24 LAB — URINALYSIS, ROUTINE W REFLEX MICROSCOPIC
Bilirubin Urine: NEGATIVE
Glucose, UA: NEGATIVE
Nitrite: NEGATIVE
Specific Gravity, Urine: 1.01
pH: 6

## 2010-12-24 LAB — RPR: RPR Ser Ql: NONREACTIVE

## 2010-12-27 ENCOUNTER — Emergency Department (HOSPITAL_COMMUNITY)
Admission: EM | Admit: 2010-12-27 | Discharge: 2010-12-27 | Disposition: A | Discharge status: Home / Self Care | Payer: Self-pay | Attending: Emergency Medicine | Admitting: Emergency Medicine

## 2010-12-27 ENCOUNTER — Emergency Department (HOSPITAL_COMMUNITY): Payer: Self-pay

## 2010-12-27 DIAGNOSIS — I4460 Unspecified fascicular block: Secondary | ICD-10-CM | POA: Insufficient documentation

## 2010-12-27 DIAGNOSIS — R51 Headache: Secondary | ICD-10-CM | POA: Insufficient documentation

## 2010-12-27 DIAGNOSIS — I10 Essential (primary) hypertension: Secondary | ICD-10-CM | POA: Insufficient documentation

## 2010-12-27 DIAGNOSIS — H539 Unspecified visual disturbance: Secondary | ICD-10-CM | POA: Insufficient documentation

## 2010-12-27 LAB — CBC
MCH: 32.1 pg (ref 26.0–34.0)
MCV: 88.3 fL (ref 78.0–100.0)
Platelets: 232 10*3/uL (ref 150–400)
RDW: 12.8 % (ref 11.5–15.5)
WBC: 4.5 10*3/uL (ref 4.0–10.5)

## 2010-12-27 LAB — BASIC METABOLIC PANEL
Calcium: 9.9 mg/dL (ref 8.4–10.5)
Creatinine, Ser: 1.11 mg/dL (ref 0.50–1.35)
GFR calc Af Amer: >90 mL/min (ref >90)

## 2010-12-27 LAB — DIFFERENTIAL
Basophils Relative: 0 % (ref 0–1)
Eosinophils Absolute: 0.1 10*3/uL (ref 0.0–0.7)
Eosinophils Relative: 2 % (ref 0–5)
Lymphs Abs: 2.5 10*3/uL (ref 0.7–4.0)
Monocytes Relative: 9 % (ref 3–12)

## 2010-12-27 LAB — PROTIME-INR: Prothrombin Time: 12.6 seconds (ref 11.6–15.2)

## 2014-02-16 ENCOUNTER — Emergency Department (HOSPITAL_COMMUNITY)
Admission: EM | Admit: 2014-02-16 | Discharge: 2014-02-16 | Disposition: A | Discharge status: Home / Self Care | Payer: Worker's Compensation | Attending: Emergency Medicine | Admitting: Emergency Medicine

## 2014-02-16 ENCOUNTER — Encounter (HOSPITAL_COMMUNITY): Payer: Self-pay | Admitting: Emergency Medicine

## 2014-02-16 DIAGNOSIS — Y9289 Other specified places as the place of occurrence of the external cause: Secondary | ICD-10-CM | POA: Insufficient documentation

## 2014-02-16 DIAGNOSIS — Y998 Other external cause status: Secondary | ICD-10-CM | POA: Insufficient documentation

## 2014-02-16 DIAGNOSIS — I10 Essential (primary) hypertension: Secondary | ICD-10-CM | POA: Insufficient documentation

## 2014-02-16 DIAGNOSIS — IMO0002 Reserved for concepts with insufficient information to code with codable children: Secondary | ICD-10-CM

## 2014-02-16 DIAGNOSIS — S61211A Laceration without foreign body of left index finger without damage to nail, initial encounter: Secondary | ICD-10-CM | POA: Insufficient documentation

## 2014-02-16 DIAGNOSIS — Y9389 Activity, other specified: Secondary | ICD-10-CM | POA: Insufficient documentation

## 2014-02-16 DIAGNOSIS — W260XXA Contact with knife, initial encounter: Secondary | ICD-10-CM | POA: Insufficient documentation

## 2014-02-16 HISTORY — DX: Essential (primary) hypertension: I10

## 2014-02-16 NOTE — Discharge Instructions (Signed)
Non-Sutured Laceration A laceration is a cut or wound that goes through all layers of the skin and into the tissue just beneath the skin. Usually, these are stitched up or held together with tape or glue shortly after the injury occurred. However, if several or more hours have passed before getting care, too many germs (bacteria) get into the laceration. Stitching it closed would bring the risk of infection. If your health care provider feels your laceration is too old, it may be left open and then bandaged to allow healing from the bottom layer up. HOME CARE INSTRUCTIONS   Change the bandage (dressing) 2 times a day or as directed by your health care provider.  If the dressing or packing gauze sticks, soak it off with soapy water.  When you re-bandage your laceration, make sure that the dressing or packing gauze goes all the way to the bottom of the laceration. The top of the laceration is kept open so it can heal from the bottom up. There is less chance for infection with this method.  Wash the area with soap and water 2 times a day to remove all the creams or ointments, if used. Rinse off the soap. Pat the area dry with a clean towel. Look for signs of infection, such as redness, swelling, or a red line that goes away from the laceration.  Re-apply creams or ointments if they were used to bandage the laceration. This helps keep the bandage from sticking.  If the bandage becomes wet, dirty, or has a bad smell, change it as soon as possible.  Only take medicine as directed by your health care provider. You might need a tetanus shot now if:  You have no idea when you had the last one.  You have never had a tetanus shot before.  Your laceration had dirt in it.  Your laceration was dirty, and your last tetanus shot was more than 7 years ago.  Your laceration was clean, and your last tetanus shot was more than 10 years ago. If you need a tetanus shot, and you decide not to get one, there is  a rare chance of getting tetanus. Sickness from tetanus can be serious. If you got a tetanus shot, your arm may swell and get red and warm to the touch at the shot site. This is common and not a problem. SEEK MEDICAL CARE IF:   You have redness, swelling, or increasing pain in the laceration.  You notice a red line that goes away from your laceration.  You have pus coming from the laceration.  You have a fever.  You notice a bad smell coming from the laceration or dressing.  You notice something coming out of the laceration, such as wood or glass.  Your laceration is on your hand or foot and you are unable to properly move a finger or toe.  You have severe swelling around the laceration, causing pain and numbness.  You notice a change in color in your arm, hand, leg, or foot. MAKE SURE YOU:   Understand these instructions.  Will watch your condition.  Will get help right away if you are not doing well or get worse. Document Released: 02/02/2006 Document Revised: 03/12/2013 Document Reviewed: 08/25/2008 [facility] Patient Information 2015 [place], [place]. This information is not intended to replace advice given to you by your health care provider. Make sure you discuss any questions you have with your health care provider.  Laceration Care, Adult A laceration is a cut  or lesion that goes through all layers of the skin and into the tissue just beneath the skin. TREATMENT  Some lacerations may not require closure. Some lacerations may not be able to be closed due to an increased risk of infection. It is important to see your caregiver as soon as possible after an injury to minimize the risk of infection and maximize the opportunity for successful closure. If closure is appropriate, pain medicines may be given, if needed. The wound will be cleaned to help prevent infection. Your caregiver will use stitches (sutures), staples, wound glue (adhesive), or skin adhesive strips to repair the  laceration. These tools bring the skin edges together to allow for faster healing and a better cosmetic outcome. However, all wounds will heal with a scar. Once the wound has healed, scarring can be minimized by covering the wound with sunscreen during the day for 1 full year. HOME CARE INSTRUCTIONS  For sutures or staples:  Keep the wound clean and dry.  If you were given a bandage (dressing), you should change it at least once a day. Also, change the dressing if it becomes wet or dirty, or as directed by your caregiver.  Wash the wound with soap and water 2 times a day. Rinse the wound off with water to remove all soap. Pat the wound dry with a clean towel.  After cleaning, apply a thin layer of the antibiotic ointment as recommended by your caregiver. This will help prevent infection and keep the dressing from sticking.  You may shower as usual after the first 24 hours. Do not soak the wound in water until the sutures are removed.  Only take over-the-counter or prescription medicines for pain, discomfort, or fever as directed by your caregiver.  Get your sutures or staples removed as directed by your caregiver. For skin adhesive strips:  Keep the wound clean and dry.  Do not get the skin adhesive strips wet. You may bathe carefully, using caution to keep the wound dry.  If the wound gets wet, pat it dry with a clean towel.  Skin adhesive strips will fall off on their own. You may trim the strips as the wound heals. Do not remove skin adhesive strips that are still stuck to the wound. They will fall off in time. For wound adhesive:  You may briefly wet your wound in the shower or bath. Do not soak or scrub the wound. Do not swim. Avoid periods of heavy perspiration until the skin adhesive has fallen off on its own. After showering or bathing, gently pat the wound dry with a clean towel.  Do not apply liquid medicine, cream medicine, or ointment medicine to your wound while the skin  adhesive is in place. This may loosen the film before your wound is healed.  If a dressing is placed over the wound, be careful not to apply tape directly over the skin adhesive. This may cause the adhesive to be pulled off before the wound is healed.  Avoid prolonged exposure to sunlight or tanning lamps while the skin adhesive is in place. Exposure to ultraviolet light in the first year will darken the scar.  The skin adhesive will usually remain in place for 5 to 10 days, then naturally fall off the skin. Do not pick at the adhesive film. You may need a tetanus shot if:  You cannot remember when you had your last tetanus shot.  You have never had a tetanus shot. If you get a tetanus shot,  your arm may swell, get red, and feel warm to the touch. This is common and not a problem. If you need a tetanus shot and you choose not to have one, there is a rare chance of getting tetanus. Sickness from tetanus can be serious. SEEK MEDICAL CARE IF:   You have redness, swelling, or increasing pain in the wound.  You see a red line that goes away from the wound.  You have yellowish-white fluid (pus) coming from the wound.  You have a fever.  You notice a bad smell coming from the wound or dressing.  Your wound breaks open before or after sutures have been removed.  You notice something coming out of the wound such as wood or glass.  Your wound is on your hand or foot and you cannot move a finger or toe. SEEK IMMEDIATE MEDICAL CARE IF:   Your pain is not controlled with prescribed medicine.  You have severe swelling around the wound causing pain and numbness or a change in color in your arm, hand, leg, or foot.  Your wound splits open and starts bleeding.  You have worsening numbness, weakness, or loss of function of any joint around or beyond the wound.  You develop painful lumps near the wound or on the skin anywhere on your body. MAKE SURE YOU:   Understand these  instructions.  Will watch your condition.  Will get help right away if you are not doing well or get worse. Document Released: 03/07/2005 Document Revised: 05/30/2011 Document Reviewed: 08/31/2010 [facility] Patient Information 2015 [place], [place]. This information is not intended to replace advice given to you by your health care provider. Make sure you discuss any questions you have with your health care provider.   Emergency Department Resource Guide 1) Find a Doctor and Pay Out of Pocket Although you won't have to find out who is covered by your insurance plan, it is a good idea to ask around and get recommendations. You will then need to call the office and see if the doctor you have chosen will accept you as a new patient and what types of options they offer for patients who are self-pay. Some doctors offer discounts or will set up payment plans for their patients who do not have insurance, but you will need to ask so you aren't surprised when you get to your appointment.  2) Contact Your Local Health Department Not all health departments have doctors that can see patients for sick visits, but many do, so it is worth a call to see if yours does. If you don't know where your local health department is, you can check in your phone book. The CDC also has a tool to help you locate your state's health department, and many state websites also have listings of all of their local health departments.  3) Find a Walk-in Clinic If your illness is not likely to be very severe or complicated, you may want to try a walk in clinic. These are popping up all over the country in pharmacies, drugstores, and shopping centers. They're usually staffed by nurse practitioners or physician assistants that have been trained to treat common illnesses and complaints. They're usually fairly quick and inexpensive. However, if you have serious medical issues or chronic medical problems, these are probably not your best  option.  No Primary Care Doctor: - Call Health Connect at  [phone] - they can help you locate a primary care doctor that  accepts your insurance, provides certain  services, etc. - Physician Referral Service- [phone]  Chronic Pain Problems: Organization         Address  Phone   Notes  [facility]  [phone] Patients need to be referred by their primary care doctor.   Medication Assistance: Organization         Address  Phone   Notes  [facility] [address] [place], [place] [address] [phone] --Must be a resident of [place] [facility] -- Must have NO insurance coverage whatsoever (no Medicaid/ Medicare, etc.) -- The pt. MUST have a primary care doctor that directs their care regularly and follows them in the community   MedAssist  [phone]) [phone]   [employer]  [phone]) [phone]    Agencies that provide inexpensive medical care: Organization         Address  Phone   Notes  [facility]  [phone]   [facility]    [phone]   [facility] [address] [place], [place] [address] [phone]) [phone]   [facility] [place] [address], [place] [phone]   Planned Parenthood    [phone]   [facility]    [phone]   [facility] and [facility]  [address], [place] Phone:  [phone](336) [phone], Fax:  [phone] Hours of Operation:  9 am - 6 pm, M-F.  Also accepts Medicaid/Medicare and self-pay.  [facility]  [address], [place] Phone: [phone](336) [phone], Fax: [phone]. Hours of Operation:  8:30 am - 5:30 pm, M-F.  Also accepts Medicaid and self-pay.  [facility] [place] [address], [place] Phone: [phone](336) [phone]   [facility] [address], [place], [place] [phone], Ext. [phone] Mondays & Thursdays: 7-9 AM.  First 15  patients are seen on a first come, first serve basis.    Medicaid-accepting [facility] Providers:  Organization         Address  Phone   Notes  [facility] [address], [place] [phone] Also accepts self-pay patients.  [facility] [address], [place]  [phone]) [phone]   [facility] [address], [place] [phone]) [phone]   [facility] [address] [place] [address], [place] [phone]) [phone]   Renaye RakersVeita Bland [address], [place]   [phone]) [phone] Only accepts [place] Access [place] patients after they have their name applied to their card.   Self-Pay (no insurance) in [facility] [place]:  Organization         Address  Phone   Notes  Sickle Cell Patients, [facility] [address], [place] [phone]) [phone]   [facility] [address], [place] [phone]   [facility] [place]  [address], [place] [phone]) [phone]   [facility]/Dr. Osei-Bonsu  [address] [place] [address], [place] or [id number], [place] [phone] Phone number for both [place] and [place] locations is the same.  [facility] [address], [place] [phone]   [facility] [place] [address] [place] [address], [place] or [address] [phone]   [facility] [address] [place] [address] [place], [place] (  [phone]) [phone], phone; [phone], fax Sees patients 1st and 3rd Saturday of every month.  Must not qualify for public or private insurance (i.e. Medicaid, Medicare, [place] Health Choice, Veterans' Benefits)  Household income should be no more than 200% of the poverty level The clinic cannot treat you if you are pregnant or think you are pregnant  Sexually transmitted diseases are not treated at the clinic.    Dental  Care: Organization         Address  Phone  Notes  [facility] Department of [facility] [address] [place], [place] [phone] Accepts children up to age 68 who are enrolled in [place] or [place] Health Choice; pregnant women with a Medicaid card; and children who have applied for Medicaid or [place] Health Choice, but were declined, whose parents can pay a reduced fee at time of service.  [facility] Department of [facility]  [address], [place] [phone] Accepts children up to age 75 who are enrolled in [place] or  Health Choice; pregnant women with a Medicaid card; and children who have applied for Medicaid or  Health Choice, but were declined, whose parents can pay a reduced fee at time of service.  [facility]  [address] [place], [place] [phone] Patients are seen by appointment only. Walk-ins are not accepted. [facility] will see patients 106 years of age and older. Monday - Tuesday (8am-5pm) Most Wednesdays (8:30-5pm) $30 per visit, cash only  [facility] Access PRO[facility]  [address], [facility] [phone] Patients are seen by appointment only. Walk-ins are not accepted. [facility] will see patients 7 years of age and older. One Wednesday Evening (Monthly: Volunteer Based).  $30 per visit, cash only  [employer]  [phone] for adults; Children under age 50, call Graduate Pediatric Dentistry at [phone]. Children aged 95-14, please call [phone] to request a pediatric application.  Dental services are provided in all areas of dental care including fillings, crowns and bridges, complete and partial dentures, implants, gum treatment, root canals, and extractions. Preventive care is also provided. Treatment is provided to both adults and children. Patients are selected via a lottery and there is often a waiting list.   [facility] [address] [place]  [phone] [email or web address]   [facility] [address] [place], [place] [phone], Ext. [phone] Second and Fourth Thursday of each month, opens at 6:30 AM; Clinic ends at 9 AM.  Patients are seen on a first-come first-served basis, and a limited number are seen during each clinic.   [facility]  [address] [place], [place] [phone]   Eligibility Requirements You must have lived in [place], [place], or [place] counties for at least the last three months.   You cannot be eligible for state or federal sponsored [place], including [employer], [place], or [employer].   You generally cannot be eligible for healthcare insurance through your employer.    How to apply: Eligibility screenings are held every Tuesday and Wednesday afternoon from 1:00 pm until 4:00 pm. You do not need an appointment for the interview!  [facility] [address] [place], [place] [phone]   [facility] Health Department  [phone]   [facility] Health Department  [phone]   [facility] Health Department  [phone]    Behavioral Health  Resources in the Community: Intensive Outpatient Programs Organization         Address  Phone  Notes  [facility] [address], [place], [place] [phone]   [facility] [address], [place], [place] [phone]   [facility] [address], [place], [place]  [phone]   [facility] [address],  [place], [place] [phone] or [phone]   Substance Abuse Resources Organization         Address  Phone  Notes  [facility] Drug Services  [phone]   [facility]  [phone]   [facility]  [phone]   [facility]  [phone]   Residential & Outpatient Substance Abuse Program  [phone]    Psychological Services Organization         Address  Phone  Notes  [facility]  [phone]- [phone]   [facility]  [phone]- [phone]   [facility] [address], [place] [phone] or [phone]    Mobile Crisis Teams Organization         Address  Phone  Notes  [facility], Mobile Crisis Care Unit  [phone]   [facility]  [address] [place], [place] [phone]   Doristine LocksSharon DeEsch [address] [place] [phone]    Self-Help/Support Groups Organization         Address  Phone             Notes  [facility] [place] - variety of support groups  336- [phone] Call for more information  Narcotics Anonymous (NA), [facility]aring [facility] [address], [place]  2 meetings at this location   Statisticianesidential Treatment Programs Organization         Address  Phone  Notes  [facility] [address],    [place]  [phone]   [facility]  [address], [place] [phone], [place], [place] [phone]   [facility] [address], [place] [phone] Admissions: 8am-3pm M-F  [facility] [address]    [place], [place] [phone]   [facility] [address], [place], [place] [phone]   [facility] [address]  [place], [place] [phone]   [facility] - Intensive Outpatient [address], [place], [place] [phone]   [facility] ([facility] [address]  [place], [place] [phone] or [phone]   [facility] (RT[facility]) [address] [place], [place] [phone] Accepts Medicaid  [facility] [address]  [place] [phone] Substance Abuse/Addiction Treatment   [facility] Behavioral Health Resources Organization         Address  Phone  Notes  [facility]  [phone]   Angie FavaJulie Brannon, PhD [address], [address] [place], [place]   [phone] or [phone]) [phone]   [facility] [place]   [address] [place], [place] [phone]) [phone]   [facility] [address], [place], [place] [phone] Insurance/Medicaid/sponsorship through [employer] [facility]  Faith and Families [address]                                    [place], [place] [phone]) [phone] Therapy/tele-psych/case  [facility] [address]   [place], [place] (  [phone]    Dr. Lolly Mustache  [phone]   [facility] [place]  [employer] [facility] Dept. 1) [address] [place] 2) [address] [place] 3)  [address], [place] [phone]  [phone]   [facility] Child Abuse Hotline [phone] or [phone] (After Hours)

## 2014-02-16 NOTE — ED Provider Notes (Signed)
CSN: [id number]     Arrival date & time 02/16/14  1528 History  This chart was scribed for non-physician practitioner, Ladona MowJoe Hinton Luellen, PA-C working with Derwood KaplanAnkit Nanavati, MD by Greggory StallionKayla Andersen, ED scribe. This patient was seen in room TR11C/TR11C and the patient's care was started at 5:58 PM.   Chief Complaint  Patient presents with  . Extremity Laceration    LT index finger    The history is provided by the patient. No language interpreter was used.    HPI Comments: Xavier Norman is a 48 y.o. male who presents to the Emergency Department complaining of left index finger laceration that occurred prior to arrival. States he was trying to cut plastic off of a box with a knife and accidentally cut himself. Reports severe pain to his finger with associated tingling. Movements worsen pain. Denies numbness. Pt is ambidextrous. His last tetanus was 2 years ago.   Past Medical History  Diagnosis Date  . Hypertension    History reviewed. No pertinent past surgical history. History reviewed. No pertinent family history. History  Substance Use Topics  . Smoking status: Never Smoker   . Smokeless tobacco: Not on file  . Alcohol Use: No    Review of Systems  Musculoskeletal: Positive for arthralgias.  Skin: Positive for wound.  Neurological: Negative for numbness.  All other systems reviewed and are negative.  Allergies  Review of patient's allergies indicates no known allergies.  Home Medications   Prior to Admission medications   Not on File   BP 191/110 mmHg  Pulse 67  Temp(Src) 98.4 F (36.9 C) (Oral)  Resp 20  SpO2 100%   Physical Exam  Constitutional: He is oriented to person, place, and time. He appears well-developed and well-nourished. No distress.  HENT:  Head: Normocephalic and atraumatic.  Eyes: Conjunctivae and EOM are normal.  Neck: Neck supple. No tracheal deviation present.  Cardiovascular: Normal rate.   Pulmonary/Chest: Effort normal. No respiratory distress.   Musculoskeletal: Normal range of motion.  Neurological: He is alert and oriented to person, place, and time.  Skin: Skin is warm and dry.  2-3 cm laceration over the dorsal aspect of left index finger. Laceration is well approximated, and I am unable to pull open the borders of the laceration to explore due to the fact that it does not extend past the dermis.  Psychiatric: He has a normal mood and affect. His behavior is normal.  Nursing note and vitals reviewed.   ED Course  Procedures (including critical care time)  DIAGNOSTIC STUDIES: Oxygen Saturation is 98% on RA, normal by my interpretation.    COORDINATION OF CARE: 6:02 PM-Discussed treatment plan which includes steri strips and finger splint with pt at bedside and pt agreed to plan.   Labs Review Labs Reviewed - No data to display  Imaging Review No results found.   EKG Interpretation None      MDM   Final diagnoses:  Laceration    Patient here with laceration which is very superficial and does not extend past the dermis. I am unable to move the wound or approximated any closer than it has approximated itself. I do not believe that suturing it will provide any additional benefit in the wound healing. We will therefore place Steri-Strips to ensure that the epidermis remains in place, and place patient's finger in a finger splint since this laceration is directly over the DIP joint. No evidence of tendon involvement or any involvement under the dermis. Patient is neurovascularly  intact. I discussed return precautions with patient and gave instructions on how to use the Steri-Strips and properly clean the wound. I encouraged patient to follow-up with a primary care provider. I provided patient with a resource guide to help find one. Patient was agreeable to this plan. I encouraged patient to call or return to the ER should he have any questions or concerns.  I personally performed the services described in this  documentation, which was scribed in my presence. The recorded information has been reviewed and is accurate.  BP 191/110 mmHg  Pulse 67  Temp(Src) 98.4 F (36.9 C) (Oral)  Resp 20  SpO2 100%  Signed,  Ladona MowJoe Seraphim Affinito, PA-C 4:19 AM     Monte FantasiaJoseph W Kayler Buckholtz, PA-C 02/17/14 [id number]  Derwood KaplanAnkit Nanavati, MD 02/23/14 [phone]

## 2014-02-16 NOTE — ED Notes (Signed)
Pt reports cutting his LT index finger with a knife while trying to cut plastic off of a box. Pt c/o of 10/10 pain to laceration location. Bleeding is contained by pressure dressing in triage. NAD noted. VSS.

## 2014-12-23 ENCOUNTER — Emergency Department (HOSPITAL_COMMUNITY): Payer: Worker's Compensation

## 2014-12-23 ENCOUNTER — Encounter (HOSPITAL_COMMUNITY): Payer: Self-pay | Admitting: Emergency Medicine

## 2014-12-23 ENCOUNTER — Emergency Department (HOSPITAL_COMMUNITY)
Admission: EM | Admit: 2014-12-23 | Discharge: 2014-12-23 | Disposition: A | Discharge status: Home / Self Care | Payer: Worker's Compensation | Attending: Emergency Medicine | Admitting: Emergency Medicine

## 2014-12-23 DIAGNOSIS — I10 Essential (primary) hypertension: Secondary | ICD-10-CM | POA: Insufficient documentation

## 2014-12-23 DIAGNOSIS — H5789 Other specified disorders of eye and adnexa: Secondary | ICD-10-CM

## 2014-12-23 DIAGNOSIS — H578 Other specified disorders of eye and adnexa: Secondary | ICD-10-CM | POA: Insufficient documentation

## 2014-12-23 MED ORDER — FLUORESCEIN SODIUM 1 MG OP STRP
1.0000 | ORAL_STRIP | Freq: Once | OPHTHALMIC | Status: AC
  Administered 2014-12-23: 1 via OPHTHALMIC
  Filled 2014-12-23: qty 1

## 2014-12-23 MED ORDER — ERYTHROMYCIN 5 MG/GM OP OINT: TOPICAL_OINTMENT | Freq: Four times a day (QID) | OPHTHALMIC | Status: DC

## 2014-12-23 MED ORDER — TETRACAINE HCL 0.5 % OP SOLN
1.0000 [drp] | Freq: Once | OPHTHALMIC | Status: AC
  Administered 2014-12-23: 1 [drp] via OPHTHALMIC
  Filled 2014-12-23: qty 2
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
## 2014-12-23 NOTE — Discharge Instructions (Signed)
Corneal Abrasion °The cornea is the clear covering at the front and center of the eye. When looking at the colored portion of the eye (iris), you are looking through the cornea. This very thin tissue is made up of many layers. The surface layer is a single layer of cells (corneal epithelium) and is one of the most sensitive tissues in the body. If a scratch or injury causes the corneal epithelium to come off, it is called a corneal abrasion. If the injury extends to the tissues below the epithelium, the condition is called a corneal ulcer. °CAUSES  °· Scratches. °· Trauma. °· Foreign body in the eye. °Some people have recurrences of abrasions in the area of the original injury even after it has healed (recurrent erosion syndrome). Recurrent erosion syndrome generally improves and goes away with time. °SYMPTOMS  °· Eye pain. °· Difficulty or inability to keep the injured eye open. °· The eye becomes very sensitive to light. °· Recurrent erosions tend to happen suddenly, first thing in the morning, usually after waking up and opening the eye. °DIAGNOSIS  °Your health care provider can diagnose a corneal abrasion during an eye exam. Dye is usually placed in the eye using a drop or a small paper strip moistened by your tears. When the eye is examined with a special light, the abrasion shows up clearly because of the dye. °TREATMENT  °· Small abrasions may be treated with antibiotic drops or ointment alone. °· A pressure patch may be put over the eye. If this is done, follow your doctor's instructions for when to remove the patch. Do not drive or use machines while the eye patch is on. Judging distances is hard to do with a patch on. °If the abrasion becomes infected and spreads to the deeper tissues of the cornea, a corneal ulcer can result. This is serious because it can cause corneal scarring. Corneal scars interfere with light passing through the cornea and cause a loss of vision in the involved eye. °HOME CARE  INSTRUCTIONS °· Use medicine or ointment as directed. Only take over-the-counter or prescription medicines for pain, discomfort, or fever as directed by your health care provider. °· Do not drive or operate machinery if your eye is patched. Your ability to judge distances is impaired. °· If your health care provider has given you a follow-up appointment, it is very important to keep that appointment. Not keeping the appointment could result in a severe eye infection or permanent loss of vision. If there is any problem keeping the appointment, let your health care provider know. °SEEK MEDICAL CARE IF:  °· You have pain, light sensitivity, and a scratchy feeling in one eye or both eyes. °· Your pressure patch keeps loosening up, and you can blink your eye under the patch after treatment. °· Any kind of discharge develops from the eye after treatment or if the lids stick together in the morning. °· You have the same symptoms in the morning as you did with the original abrasion days, weeks, or months after the abrasion healed. °MAKE SURE YOU:  °· Understand these instructions. °· Will watch your condition. °· Will get help right away if you are not doing well or get worse. °Document Released: 03/04/2000 Document Revised: 03/12/2013 Document Reviewed: 11/12/2012 °ExitCare® Patient Information ©2015 [employer]. This information is not intended to replace advice given to you by your health care provider. Make sure you discuss any questions you have with your health care provider. ° °

## 2014-12-23 NOTE — ED Notes (Signed)
PA at bedside.
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
## 2014-12-23 NOTE — ED Notes (Signed)
Patient transported to CT 

## 2015-07-31 ENCOUNTER — Emergency Department (HOSPITAL_COMMUNITY)
Admission: EM | Admit: 2015-07-31 | Discharge: 2015-07-31 | Disposition: A | Discharge status: Home / Self Care | Payer: Self-pay | Attending: Emergency Medicine | Admitting: Emergency Medicine

## 2015-07-31 ENCOUNTER — Encounter (HOSPITAL_COMMUNITY): Payer: Self-pay | Admitting: Emergency Medicine

## 2015-07-31 ENCOUNTER — Emergency Department (HOSPITAL_COMMUNITY): Payer: Self-pay

## 2015-07-31 DIAGNOSIS — R0602 Shortness of breath: Secondary | ICD-10-CM | POA: Insufficient documentation

## 2015-07-31 DIAGNOSIS — R110 Nausea: Secondary | ICD-10-CM | POA: Insufficient documentation

## 2015-07-31 DIAGNOSIS — R519 Headache, unspecified: Secondary | ICD-10-CM

## 2015-07-31 DIAGNOSIS — R079 Chest pain, unspecified: Secondary | ICD-10-CM | POA: Insufficient documentation

## 2015-07-31 DIAGNOSIS — I10 Essential (primary) hypertension: Secondary | ICD-10-CM | POA: Insufficient documentation

## 2015-07-31 DIAGNOSIS — H53149 Visual discomfort, unspecified: Secondary | ICD-10-CM | POA: Insufficient documentation

## 2015-07-31 DIAGNOSIS — R51 Headache: Secondary | ICD-10-CM | POA: Insufficient documentation

## 2015-07-31 HISTORY — DX: Sleep apnea, unspecified: G47.30

## 2015-07-31 LAB — CBC WITH DIFFERENTIAL/PLATELET
BASOS ABS: 0 10*3/uL (ref 0.0–0.1)
BASOS PCT: 0 %
EOS ABS: 0.1 10*3/uL (ref 0.0–0.7)
Eosinophils Relative: 1 %
HCT: 41.5 % (ref 39.0–52.0)
Hemoglobin: 14 g/dL (ref 13.0–17.0)
Lymphocytes Relative: 32 %
Lymphs Abs: 2.4 10*3/uL (ref 0.7–4.0)
MCH: 30.7 pg (ref 26.0–34.0)
MCHC: 33.7 g/dL (ref 30.0–36.0)
MCV: 91 fL (ref 78.0–100.0)
MONO ABS: 0.6 10*3/uL (ref 0.1–1.0)
MONOS PCT: 8 %
NEUTROS PCT: 59 %
Neutro Abs: 4.3 10*3/uL (ref 1.7–7.7)
Platelets: 239 10*3/uL (ref 150–400)
RBC: 4.56 MIL/uL (ref 4.22–5.81)
RDW: 12.7 % (ref 11.5–15.5)
WBC: 7.3 10*3/uL (ref 4.0–10.5)

## 2015-07-31 LAB — I-STAT TROPONIN, ED: TROPONIN I, POC: 0.03 ng/mL (ref 0.00–0.08)

## 2015-07-31 LAB — BASIC METABOLIC PANEL
Anion gap: 12 (ref 5–15)
BUN: 12 mg/dL (ref 6–20)
CALCIUM: 9.5 mg/dL (ref 8.9–10.3)
CO2: 25 mmol/L (ref 22–32)
CREATININE: 1.13 mg/dL (ref 0.61–1.24)
Chloride: 104 mmol/L (ref 101–111)
Glucose, Bld: 89 mg/dL (ref 65–99)
Potassium: 3.6 mmol/L (ref 3.5–5.1)
SODIUM: 141 mmol/L (ref 135–145)

## 2015-07-31 MED ORDER — PROCHLORPERAZINE EDISYLATE 5 MG/ML IJ SOLN
10.0000 mg | Freq: Once | INTRAMUSCULAR | Status: AC
  Administered 2015-07-31: 10 mg via INTRAVENOUS
  Filled 2015-07-31: qty 2

## 2015-07-31 MED ORDER — KETOROLAC TROMETHAMINE 30 MG/ML IJ SOLN
15.0000 mg | Freq: Once | INTRAMUSCULAR | Status: AC
  Administered 2015-07-31: 30 mg via INTRAVENOUS
  Filled 2015-07-31: qty 1

## 2015-07-31 MED ORDER — MAGNESIUM SULFATE IN D5W 1-5 GM/100ML-% IV SOLN
1.0000 g | Freq: Once | INTRAVENOUS | Status: AC
  Administered 2015-07-31: 1 g via INTRAVENOUS
  Filled 2015-07-31: qty 100

## 2015-07-31 MED ORDER — HYDRALAZINE HCL 20 MG/ML IJ SOLN
5.0000 mg | Freq: Once | INTRAMUSCULAR | Status: AC
  Administered 2015-07-31: 5 mg via INTRAVENOUS
  Filled 2015-07-31: qty 1

## 2015-07-31 MED ORDER — DIPHENHYDRAMINE HCL 50 MG/ML IJ SOLN
25.0000 mg | Freq: Once | INTRAMUSCULAR | Status: AC
  Administered 2015-07-31: 25 mg via INTRAVENOUS
  Filled 2015-07-31: qty 1

## 2015-07-31 MED ORDER — SODIUM CHLORIDE 0.9 % IV BOLUS (SEPSIS)
1000.0000 mL | Freq: Once | INTRAVENOUS | Status: AC
  Administered 2015-07-31: 1000 mL via INTRAVENOUS

## 2015-07-31 NOTE — ED Notes (Signed)
Unable to do CT due to being claustrophobic

## 2015-07-31 NOTE — ED Notes (Signed)
When this nurse enters the room patient is sleeping - arouses easily

## 2015-07-31 NOTE — ED Notes (Signed)
Pt. reports headache with nausea and photophobia onset yesterday , denies head injury or fever .

## 2015-07-31 NOTE — Discharge Instructions (Signed)
You were seen today for headache and hypertension. You need to ensure that you take your blood pressure medications daily. If you have any new or worsening symptoms you should be reevaluated immediately.  Hypertension Hypertension, commonly called high blood pressure, is when the force of blood pumping through your arteries is too strong. Your arteries are the blood vessels that carry blood from your heart throughout your body. A blood pressure reading consists of a higher number over a lower number, such as 110/72. The higher number (systolic) is the pressure inside your arteries when your heart pumps. The lower number (diastolic) is the pressure inside your arteries when your heart relaxes. Ideally you want your blood pressure below 120/80. Hypertension forces your heart to work harder to pump blood. Your arteries may become narrow or stiff. Having untreated or uncontrolled hypertension can cause heart attack, stroke, kidney disease, and other problems. RISK FACTORS Some risk factors for high blood pressure are controllable. Others are not.  Risk factors you cannot control include:   Race. You may be at higher risk if you are African American.  Age. Risk increases with age.  Gender. Men are at higher risk than women before age 49 years. After age 10265, women are at higher risk than men. Risk factors you can control include:  Not getting enough exercise or physical activity.  Being overweight.  Getting too much fat, sugar, calories, or salt in your diet.  Drinking too much alcohol. SIGNS AND SYMPTOMS Hypertension does not usually cause signs or symptoms. Extremely high blood pressure (hypertensive crisis) may cause headache, anxiety, shortness of breath, and nosebleed. DIAGNOSIS To check if you have hypertension, your health care provider will measure your blood pressure while you are seated, with your arm held at the level of your heart. It should be measured at least twice using the same  arm. Certain conditions can cause a difference in blood pressure between your right and left arms. A blood pressure reading that is higher than normal on one occasion does not mean that you need treatment. If it is not clear whether you have high blood pressure, you may be asked to return on a different day to have your blood pressure checked again. Or, you may be asked to monitor your blood pressure at home for 1 or more weeks. TREATMENT Treating high blood pressure includes making lifestyle changes and possibly taking medicine. Living a healthy lifestyle can help lower high blood pressure. You may need to change some of your habits. Lifestyle changes may include:  Following the DASH diet. This diet is high in fruits, vegetables, and whole grains. It is low in salt, red meat, and added sugars.  Keep your sodium intake below 2,300 mg per day.  Getting at least 30-45 minutes of aerobic exercise at least 4 times per week.  Losing weight if necessary.  Not smoking.  Limiting alcoholic beverages.  Learning ways to reduce stress. Your health care provider may prescribe medicine if lifestyle changes are not enough to get your blood pressure under control, and if one of the following is true:  You are 3018-49 years of age and your systolic blood pressure is above 140.  You are 49 years of age or older, and your systolic blood pressure is above 150.  Your diastolic blood pressure is above 90.  You have diabetes, and your systolic blood pressure is over 140 or your diastolic blood pressure is over 90.  You have kidney disease and your blood pressure is  above 140/90.  You have heart disease and your blood pressure is above 140/90. Your personal target blood pressure may vary depending on your medical conditions, your age, and other factors. HOME CARE INSTRUCTIONS  Have your blood pressure rechecked as directed by your health care provider.   Take medicines only as directed by your health  care provider. Follow the directions carefully. Blood pressure medicines must be taken as prescribed. The medicine does not work as well when you skip doses. Skipping doses also puts you at risk for problems.  Do not smoke.   Monitor your blood pressure at home as directed by your health care provider. SEEK MEDICAL CARE IF:   You think you are having a reaction to medicines taken.  You have recurrent headaches or feel dizzy.  You have swelling in your ankles.  You have trouble with your vision. SEEK IMMEDIATE MEDICAL CARE IF:  You develop a severe headache or confusion.  You have unusual weakness, numbness, or feel faint.  You have severe chest or abdominal pain.  You vomit repeatedly.  You have trouble breathing. MAKE SURE YOU:   Understand these instructions.  Will watch your condition.  Will get help right away if you are not doing well or get worse.   This information is not intended to replace advice given to you by your health care provider. Make sure you discuss any questions you have with your health care provider.   Document Released: 03/07/2005 Document Revised: 07/22/2014 Document Reviewed: 12/28/2012 Elsevier Interactive Patient Education [employer]

## 2015-07-31 NOTE — ED Notes (Signed)
Patient returned from xray.  No c/o CP to this nurse just c/o head hurting.  Eating graham crackers and drinking water without difficulty

## 2015-07-31 NOTE — ED Provider Notes (Signed)
CSN: [id number]     Arrival date & time 07/31/15  0041 History  By signing my name below, I, Phillis Haggis, attest that this documentation has been prepared under the direction and in the presence of Shon Baton, MD. Electronically Signed: Phillis Haggis, ED Scribe. 07/31/2015. 2:51 AM.  No chief complaint on file.  The history is provided by the patient. No language interpreter was used.  HPI Comments: Joban Colledge is a 50 y.o. Male with a hx of HTN who presents to the Emergency Department complaining of a gradually worsening occipital headache onset two days ago. Pt reports associated constant, tight chest pain that began one day ago, SOB, photophobia and nausea. He currently rates his pain 8/10. NOthing makes it better or worse.  Pt took two Tylenol to no relief. Pt states that he took his HTN medication as normal today.  Does not know what he takes. He denies hx of heart disease, DM, similar headache or similar chest pain. He denies head injury, fever, diarrhea, or vomiting.She denies worst headache of his life.   Past Medical History  Diagnosis Date  . Hypertension   . Sleep apnea    History reviewed. No pertinent past surgical history. No family history on file. Social History  Substance Use Topics  . Smoking status: Never Smoker   . Smokeless tobacco: None  . Alcohol Use: No    Review of Systems  Eyes: Positive for photophobia.  Respiratory: Negative for shortness of breath.   Cardiovascular: Positive for chest pain.  Gastrointestinal: Positive for nausea.  Neurological: Positive for headaches.  All other systems reviewed and are negative.  Allergies  Review of patient's allergies indicates no known allergies.  Home Medications   Prior to Admission medications   Not on File   BP 165/92 mmHg  Pulse 86  Temp(Src) 98.2 F (36.8 C) (Oral)  Resp 18  Ht  (1.702 m)  Wt 213 lb 9 oz (96.871 kg)  BMI 33.44 kg/m2  SpO2 99% Physical Exam  Constitutional: He is  oriented to person, place, and time. He appears well-developed and well-nourished. No distress.  HENT:  Head: Normocephalic and atraumatic.  Mouth/Throat: Oropharynx is clear and moist.  Eyes: Pupils are equal, round, and reactive to light.  Neck: Normal range of motion. Neck supple.  No meningismus  Cardiovascular: Normal rate, regular rhythm and normal heart sounds.   No murmur heard. Pulmonary/Chest: Effort normal and breath sounds normal. No respiratory distress. He has no wheezes.  Abdominal: Soft. Bowel sounds are normal. There is no tenderness. There is no rebound.  Musculoskeletal: He exhibits no edema.  Neurological: He is alert and oriented to person, place, and time.  Cranial nerves II through XII intact, 5 out of 5 strength in all 4 extremities, normal gait  Skin: Skin is warm and dry.  Psychiatric: He has a normal mood and affect.  Nursing note and vitals reviewed.   ED Course  Procedures (including critical care time) DIAGNOSTIC STUDIES: Oxygen Saturation is 98% on RA, normal by my interpretation.    COORDINATION OF CARE: 2:47 AM-Discussed treatment plan which includes labs with pt at bedside and pt agreed to plan.    Labs Review Labs Reviewed  CBC WITH DIFFERENTIAL/PLATELET  BASIC METABOLIC PANEL  Rosezena Sensor, ED    Imaging Review Dg Chest 2 View  07/31/2015  CLINICAL DATA:  Chest pain, cough, shortness of breath and fever for 2 days. History of hypertension. EXAM: CHEST  2 VIEW COMPARISON:  None. FINDINGS: The heart size and mediastinal contours are within normal limits. Mild bronchitic changes. Both lungs are clear. The visualized skeletal structures are unremarkable. IMPRESSION: Mild bronchitic changes without focal consolidation. Electronically Signed   By: Awilda Metroourtnay  Bloomer M.D.   On: 07/31/2015 03:41   I have personally reviewed and evaluated these images and lab results as part of my medical decision-making.   EKG Interpretation   Date/Time:   Friday Jul 31 2015 03:40:05 EDT Ventricular Rate:  83 PR Interval:  166 QRS Duration: 102 QT Interval:  381 QTC Calculation: 448 R Axis:   -50 Text Interpretation:  Sinus rhythm   LAFB Consider right ventricular  hypertrophy Nonspecific T abnormalities, lateral leads No significant  change since last tracing Confirmed by Eyanna Mcgonagle  MD, Toni AmendOURTNEY ([id number]) on  07/31/2015 4:31:56 AM Also confirmed by Wilkie AyeHORTON  MD, Apollo Timothy ([id number]),  editor [place]  CCT, BEVERLY ([id number])  on 07/31/2015 6:51:29 AM      MDM   Final diagnoses:  Acute nonintractable headache, unspecified headache type  Essential hypertension    Patient presents with headache. Noted to be hypertensive. Denies worst headache of his life. Neurologically intact. Basic labwork obtained.  EKG unchanged from prior. Troponin negative. Chest x-ray reassuring. Patient was given a migraine cocktail and magnesium as well as hydralazine given his pressure. On initial recheck, patient reports some improvement of his pain. He states that his headache does persist. CT scan was ordered as well as an additional dose of hydralazine and Toradol. Patient refused CT. Reports improvement with blood pressure control. Suspect mild hypertensive urgency. No longer has chest pain. Heart score is 2 for age and risk factors.  Discussed with patient the importance of following up with his primary physician and referral for cardiology evaluation. He alsotake his blood pressure medication daily. Patient stated understanding. If he has any new or worsening symptoms he should be reevaluated.    After history, exam, and medical workup I feel the patient has been appropriately medically screened and is safe for discharge home. Pertinent diagnoses were discussed with the patient. Patient was given return precautions.   I personally performed the services described in this documentation, which was scribed in my presence. The recorded information has been reviewed and is  accurate.    Shon Batonourtney F Natilie Krabbenhoft, MD 07/31/15 2340

## 2015-10-09 ENCOUNTER — Encounter (HOSPITAL_COMMUNITY): Payer: Self-pay | Admitting: Emergency Medicine

## 2015-10-09 ENCOUNTER — Emergency Department (HOSPITAL_COMMUNITY)
Admission: EM | Admit: 2015-10-09 | Discharge: 2015-10-10 | Disposition: A | Discharge status: Home / Self Care | Payer: Self-pay | Attending: Emergency Medicine | Admitting: Emergency Medicine

## 2015-10-09 DIAGNOSIS — M79632 Pain in left forearm: Secondary | ICD-10-CM

## 2015-10-09 DIAGNOSIS — I10 Essential (primary) hypertension: Secondary | ICD-10-CM | POA: Insufficient documentation

## 2015-10-09 DIAGNOSIS — M25522 Pain in left elbow: Secondary | ICD-10-CM

## 2015-10-09 DIAGNOSIS — M7989 Other specified soft tissue disorders: Secondary | ICD-10-CM | POA: Insufficient documentation

## 2015-10-09 NOTE — ED Notes (Signed)
Pt. reports bitten by an insect while cutting grass this evening presents with left elbow swelling radiating to left arm , airway intact / respirations unlabored , hypertensive at triage .

## 2015-10-10 ENCOUNTER — Emergency Department (HOSPITAL_COMMUNITY): Payer: Self-pay

## 2015-10-10 LAB — CBC WITH DIFFERENTIAL/PLATELET
BASOS PCT: 0 %
Basophils Absolute: 0 10*3/uL (ref 0.0–0.1)
EOS ABS: 0.2 10*3/uL (ref 0.0–0.7)
EOS PCT: 2 %
HEMATOCRIT: 40.3 % (ref 39.0–52.0)
Hemoglobin: 14.3 g/dL (ref 13.0–17.0)
Lymphocytes Relative: 23 %
Lymphs Abs: 2 10*3/uL (ref 0.7–4.0)
MCH: 31.9 pg (ref 26.0–34.0)
MCHC: 35.5 g/dL (ref 30.0–36.0)
MCV: 90 fL (ref 78.0–100.0)
MONO ABS: 0.7 10*3/uL (ref 0.1–1.0)
MONOS PCT: 8 %
NEUTROS ABS: 5.9 10*3/uL (ref 1.7–7.7)
Neutrophils Relative %: 67 %
Platelets: 201 10*3/uL (ref 150–400)
RBC: 4.48 MIL/uL (ref 4.22–5.81)
RDW: 12.6 % (ref 11.5–15.5)
WBC: 8.8 10*3/uL (ref 4.0–10.5)

## 2015-10-10 LAB — BASIC METABOLIC PANEL
Anion gap: 6 (ref 5–15)
BUN: 11 mg/dL (ref 6–20)
CO2: 27 mmol/L (ref 22–32)
CREATININE: 1.13 mg/dL (ref 0.61–1.24)
Calcium: 9.2 mg/dL (ref 8.9–10.3)
Chloride: 106 mmol/L (ref 101–111)
GFR calc Af Amer: >60 mL/min (ref >60)
GFR calc non Af Amer: >60 mL/min (ref >60)
Glucose, Bld: 101 mg/dL — ABNORMAL HIGH (ref 65–99)
Potassium: 3.4 mmol/L — ABNORMAL LOW (ref 3.5–5.1)
SODIUM: 139 mmol/L (ref 135–145)

## 2015-10-10 LAB — CK: Total CK: 657 U/L — ABNORMAL HIGH (ref 49–397)

## 2015-10-10 LAB — SEDIMENTATION RATE: SED RATE: 5 mm/h (ref 0–16)

## 2015-10-10 MED ORDER — OXYCODONE-ACETAMINOPHEN 5-325 MG PO TABS: 1.0000 | ORAL_TABLET | Freq: Four times a day (QID) | ORAL | Status: DC | PRN

## 2015-10-10 MED ORDER — ONDANSETRON HCL 4 MG/2ML IJ SOLN
4.0000 mg | Freq: Once | INTRAMUSCULAR | Status: AC
  Administered 2015-10-10: 4 mg via INTRAVENOUS
  Filled 2015-10-10: qty 2

## 2015-10-10 MED ORDER — HYDROMORPHONE HCL 1 MG/ML IJ SOLN
0.5000 mg | Freq: Once | INTRAMUSCULAR | Status: AC
  Administered 2015-10-10: 0.5 mg via INTRAVENOUS
  Filled 2015-10-10: qty 1

## 2015-10-10 MED ORDER — NAPROXEN 500 MG PO TABS: 500.0000 mg | ORAL_TABLET | Freq: Two times a day (BID) | ORAL | Status: DC

## 2015-10-10 NOTE — ED Notes (Signed)
The pt reports that something bit him on the lt elbow earlier tonight  C/o pain sl swelling lt elbow   He has not had his bp med for 2-3 days  Its high

## 2015-10-10 NOTE — ED Provider Notes (Signed)
CSN: [id number]     Arrival date & time 10/09/15  2235 History   First MD Initiated Contact with Patient 10/10/15 0211     Chief Complaint  Patient presents with  . Insect Bite     (Consider location/radiation/quality/duration/timing/severity/associated sxs/prior Treatment) HPI Comments: Patient presents with acute onset of gradually worsening left forearm swelling starting last evening. Patient states that he was outside mowing and thinks that he may have been bitten by an insect. He denies feeling any insect bites or stings. He has had swelling of his right lateral forearm with associated pain in the elbow. Movement of the wrist and elbow makes the pain much worse. Patient has a history of hypertension for which he takes medications. No fevers or redness. Denies other injuries.   The history is provided by the patient.    Past Medical History  Diagnosis Date  . Hypertension   . Sleep apnea    History reviewed. No pertinent past surgical history. No family history on file. Social History  Substance Use Topics  . Smoking status: Never Smoker   . Smokeless tobacco: None  . Alcohol Use: No    Review of Systems  Constitutional: Negative for fever and activity change.  HENT: Negative for rhinorrhea and sore throat.   Eyes: Negative for redness.  Respiratory: Negative for cough.   Cardiovascular: Negative for chest pain.  Gastrointestinal: Negative for nausea, vomiting, abdominal pain and diarrhea.  Genitourinary: Negative for dysuria.  Musculoskeletal: Positive for myalgias and arthralgias. Negative for back pain, joint swelling, gait problem and neck pain.  Skin: Negative for color change, rash and wound.  Neurological: Negative for weakness, numbness and headaches.    Allergies  Review of patient's allergies indicates no known allergies.  Home Medications   Prior to Admission medications   Not on File   BP 185/107 mmHg  Pulse 92  Temp(Src) 98.8 F (37.1 C) (Oral)   Resp 18  Ht  (1.727 m)  Wt 101.606 kg  BMI 34.07 kg/m2  SpO2 97%   Physical Exam  Constitutional: He appears well-developed and well-nourished.  HENT:  Head: Normocephalic and atraumatic.  Eyes: Conjunctivae are normal.  Neck: Normal range of motion. Neck supple.  Cardiovascular: Normal pulses.   Musculoskeletal: He exhibits edema and tenderness.       Left shoulder: Normal.       Left elbow: He exhibits decreased range of motion and swelling. Tenderness found. Olecranon process tenderness noted.       Left wrist: He exhibits decreased range of motion. He exhibits no tenderness, no bony tenderness and no swelling.       Left upper arm: Normal.       Left forearm: He exhibits tenderness, swelling and edema.       Left hand: Normal.  Patient with generalized edema, swelling, and pain of the left forearm, worse over the ulnar aspect of the forearm. Also tender with palpation over the olecranon process with generalized swelling. No erythema or warmth to suggest abscess or cellulitis. No lymphangitis. Pain in the forearm is made worse with flexion and extension of the wrist. 2+ radial pulse with normal capillary refill in all fingers.  Neurological: He is alert. No sensory deficit.  Motor, sensation, and vascular distal to the injury is fully intact.   Skin: Skin is warm and dry.  Psychiatric: He has a normal mood and affect.  Nursing note and vitals reviewed.   ED Course  Procedures (including critical care time) Labs  Review Labs Reviewed  CK - Abnormal; Notable for the following:    Total CK 657 (*)    All other components within normal limits  BASIC METABOLIC PANEL - Abnormal; Notable for the following:    Potassium 3.4 (*)    Glucose, Bld 101 (*)    All other components within normal limits  CBC WITH DIFFERENTIAL/PLATELET  SEDIMENTATION RATE    Imaging Review Dg Elbow Complete Left  10/10/2015  CLINICAL DATA:  Left elbow edema.  Initial encounter. EXAM: LEFT ELBOW -  COMPLETE 3+ VIEW COMPARISON:  05/30/2008 forearm x-ray FINDINGS: Soft tissue swelling about the olecranon process without discrete bursa. No acute fracture or subluxation. Enthesophytes about the olecranon process. No definitive joint effusion. IMPRESSION: Soft tissue swelling about the olecranon without opaque foreign body or acute osseous finding. Electronically Signed   By: Marnee Spring M.D.   On: 10/10/2015 04:22   I have personally reviewed and evaluated these images and lab results as part of my medical decision-making.   2:30 AM Patient seen and examined. Work-up initiated. Medications ordered.   Vital signs reviewed and are as follows: BP 185/107 mmHg  Pulse 92  Temp(Src) 98.8 F (37.1 C) (Oral)  Resp 18  Ht 5\' 8"  (1.727 m)  Wt 101.606 kg  BMI 34.07 kg/m2  SpO2 97%  Patient discussed with and seen by Dr. Adela Lank.   5:52 AM Reviewed lab results with patient. Pain is controlled. Forearm exam is unchanged. Continues with good distal pulses, normal sensation. Patient provided with sling. Counseled to keep the arm elevated. Patient encouraged to take his blood pressure medication. Prescribed pain medication and anti-inflammatories.  Patient counseled on use of narcotic pain medications. Counseled not to combine these medications with others containing tylenol. Urged not to drink alcohol, drive, or perform any other activities that requires focus while taking these medications. The patient verbalizes understanding and agrees with the plan.   Patient states that he does not currently have a PCP. Encouraged to return to the emergency department for recheck in 2 days if not improved.  Pt urged to return with worsening pain, worsening swelling, expanding area of redness or streaking up extremity, numbness or tingling in the fingers, fever, or any other concerns. Pt verbalizes understanding and agrees with plan.    MDM   Final diagnoses:  Pain and swelling of left forearm  Left elbow  pain   Patient with acute onset of forearm pain and swelling. No definite bee sting or insect bite. No gross abscess or cellulitis. Mildly elevated CK normal white blood cell count. Normal sedimentation rate. X-rays show swelling without discrete bursitis. No progression of symptoms during ED stay. Do not suspect compartment syndrome or neurovascular compromise. Swelling is not worsening and is stable at time of discharge. Low suspicion for septic joint. Patient can range his joints, no fever or other systemic symptoms of illness, no significant risk factors.    Renne Crigler, PA-C 10/10/15 0554  Melene Plan, DO 10/10/15 2563

## 2015-10-10 NOTE — ED Notes (Addendum)
Pt A&OX4, ambulatory at d/c with steady gait, NAD. Pt reports he has all of his belongings with him at discharge

## 2015-10-10 NOTE — Discharge Instructions (Signed)
Please read and follow all provided instructions.  Your diagnoses today include:  1. Pain and swelling of left forearm   2. Left elbow pain     Tests performed today include:  An x-ray of the affected area - does NOT show any broken bones, shows swelling of the forearm and elbow  Blood counts and electrolytes - no major infection  Muscle breakdown test - slight amount of muscle breakdown  Vital signs. See below for your results today.   Medications prescribed:   Naproxen - anti-inflammatory pain medication  Do not exceed 500mg  naproxen every 12 hours, take with food  You have been prescribed an anti-inflammatory medication or NSAID. Take with food. Take smallest effective dose for the shortest duration needed for your pain. Stop taking if you experience stomach pain or vomiting.    Percocet (oxycodone/acetaminophen) - narcotic pain medication  DO NOT drive or perform any activities that require you to be awake and alert because this medicine can make you drowsy. BE VERY CAREFUL not to take multiple medicines containing Tylenol (also called acetaminophen). Doing so can lead to an overdose which can damage your liver and cause liver failure and possibly death.  Take any prescribed medications only as directed.  Home care instructions:   Follow any educational materials contained in this packet  Follow R.I.C.E. Protocol:  R - rest your injury   I  - use ice on injury without applying directly to skin  C - compress injury with bandage or splint  E - elevate the injury as much as possible  Follow-up instructions: Please follow-up with your primary care provider in 2 days for a recheck. In this case you may have a more severe injury that requires further care.   Return instructions:   Please return if your fingers are numb or tingling, appear gray or blue, or you have severe pain (also elevate the arm and loosen splint or wrap if you were given one)  Return if you have  worsening redness, warmth, fever, streaking redness up the arm.  Please return to the Emergency Department if you experience worsening symptoms.   Please return if you have any other emergent concerns.  Additional Information:  Your vital signs today were: BP 191/115 mmHg   Pulse 74   Temp(Src) 98.8 F (37.1 C) (Oral)   Resp 18   Ht 5\' 8"  (1.727 m)   Wt 101.606 kg   BMI 34.07 kg/m2   SpO2 100% If your blood pressure (BP) was elevated above 135/85 this visit, please have this repeated by your doctor within one month. --------------

## 2016-03-23 ENCOUNTER — Emergency Department (HOSPITAL_COMMUNITY): Payer: Self-pay

## 2016-03-23 ENCOUNTER — Encounter (HOSPITAL_COMMUNITY): Payer: Self-pay | Admitting: Emergency Medicine

## 2016-03-23 ENCOUNTER — Emergency Department (HOSPITAL_COMMUNITY)
Admission: EM | Admit: 2016-03-23 | Discharge: 2016-03-23 | Disposition: A | Discharge status: Home / Self Care | Payer: Self-pay | Attending: Emergency Medicine | Admitting: Emergency Medicine

## 2016-03-23 DIAGNOSIS — Z79899 Other long term (current) drug therapy: Secondary | ICD-10-CM | POA: Insufficient documentation

## 2016-03-23 DIAGNOSIS — Y999 Unspecified external cause status: Secondary | ICD-10-CM | POA: Insufficient documentation

## 2016-03-23 DIAGNOSIS — H5711 Ocular pain, right eye: Secondary | ICD-10-CM | POA: Insufficient documentation

## 2016-03-23 DIAGNOSIS — H539 Unspecified visual disturbance: Secondary | ICD-10-CM

## 2016-03-23 DIAGNOSIS — Z23 Encounter for immunization: Secondary | ICD-10-CM | POA: Insufficient documentation

## 2016-03-23 DIAGNOSIS — Y9389 Activity, other specified: Secondary | ICD-10-CM | POA: Insufficient documentation

## 2016-03-23 DIAGNOSIS — W228XXA Striking against or struck by other objects, initial encounter: Secondary | ICD-10-CM | POA: Insufficient documentation

## 2016-03-23 DIAGNOSIS — H538 Other visual disturbances: Secondary | ICD-10-CM | POA: Insufficient documentation

## 2016-03-23 DIAGNOSIS — I10 Essential (primary) hypertension: Secondary | ICD-10-CM | POA: Insufficient documentation

## 2016-03-23 DIAGNOSIS — Y929 Unspecified place or not applicable: Secondary | ICD-10-CM | POA: Insufficient documentation

## 2016-03-23 MED ORDER — FLUORESCEIN SODIUM 0.6 MG OP STRP
1.0000 | ORAL_STRIP | Freq: Once | OPHTHALMIC | Status: AC
  Administered 2016-03-23: 1 via OPHTHALMIC
  Filled 2016-03-23: qty 1

## 2016-03-23 MED ORDER — LISINOPRIL 20 MG PO TABS: 20.0000 mg | ORAL_TABLET | Freq: Every day | ORAL | 0 refills | Status: DC

## 2016-03-23 MED ORDER — TETANUS-DIPHTH-ACELL PERTUSSIS 5-2.5-18.5 LF-MCG/0.5 IM SUSP
0.5000 mL | Freq: Once | INTRAMUSCULAR | Status: AC
  Administered 2016-03-23: 0.5 mL via INTRAMUSCULAR
  Filled 2016-03-23: qty 0.5

## 2016-03-23 MED ORDER — TETRACAINE HCL 0.5 % OP SOLN
2.0000 [drp] | Freq: Once | OPHTHALMIC | Status: AC
  Administered 2016-03-23: 2 [drp] via OPHTHALMIC
  Filled 2016-03-23: qty 2

## 2016-03-23 MED ORDER — LORAZEPAM 1 MG PO TABS
1.0000 mg | ORAL_TABLET | Freq: Once | ORAL | Status: AC
  Administered 2016-03-23: 1 mg via ORAL
  Filled 2016-03-23: qty 1

## 2016-03-23 MED ORDER — LISINOPRIL 20 MG PO TABS
20.0000 mg | ORAL_TABLET | Freq: Once | ORAL | Status: AC
  Administered 2016-03-23: 20 mg via ORAL
  Filled 2016-03-23: qty 1

## 2016-03-23 NOTE — ED Triage Notes (Signed)
Pt. reported a piece of broken glass hit his right eye this evening, pt. is hypertensive at triage he stated that he has not taken his antihypertensive medication for > 1 week .

## 2016-03-23 NOTE — ED Notes (Signed)
Pt ambulated to room from waiting room, tolerated well. Pt placed in gown and on monitor. 

## 2016-03-23 NOTE — ED Notes (Signed)
Gave pt [place] sandwich, water and crackers

## 2016-03-23 NOTE — ED Notes (Signed)
NAD noted on discharge. Pt refused wheelchair upon discharge.

## 2016-03-23 NOTE — ED Notes (Signed)
Pt feels like he could attempt going to CT now after having ativan. Notified CT

## 2016-03-23 NOTE — Discharge Planning (Signed)
Winter Trefz J. Lucretia RoersWood, RN, BSN, [place] [phone] [facility] with Armeniahina Hollis, NP on 04/05/16 @ 0900.  Spoke with pt at bedside and advised to please arrive 15 min early and take a picture ID and your current medications.  Pt verbalizes understanding of keeping appointment.

## 2016-03-23 NOTE — ED Provider Notes (Signed)
[facility]-EMERGENCY DEPT Provider Note   CSN: [id number] Arrival date & time: 03/23/16  0141     History   Chief Complaint Chief Complaint  Patient presents with  . [place]  . Hypertension    HPI Xavier Norman is a 51 y.o. male.  HPI   Washing dishes 1130PM and dropped glass which shattered and felt like something went into right eye. Feels like glass is in eye.  Pain is 8/10.  Vision in right eye is blurry. Watering eye.  No hx of eye problems but noticed vision slowly worsening over last months.  Had similar FB sensation after glass broke Oct 2016.   Has not taken blood pressure medicines in one week [place] here)   No chest pain, no numbness/weakness, no dyspnea.    Past Medical History:  Diagnosis Date  . Hypertension   . Sleep apnea     There are no active problems to display for this patient.   History reviewed. No pertinent surgical history.     Home Medications    Prior to Admission medications   Medication Sig Start Date End Date Taking? Authorizing Provider  atorvastatin (LIPITOR) 20 MG tablet Take 20 mg by mouth daily.   Yes Historical Provider, MD  naproxen (NAPROSYN) 500 MG tablet Take 1 tablet (500 mg total) by mouth 2 (two) times daily. 10/10/15  Yes Renne CriglerJoshua Geiple, PA-C  oxyCODONE-acetaminophen (PERCOCET/ROXICET) 5-325 MG tablet Take 1-2 tablets by mouth every 6 (six) hours as needed for severe pain. 10/10/15  Yes Renne CriglerJoshua Geiple, PA-C  lisinopril (PRINIVIL,ZESTRIL) 20 MG tablet Take 1 tablet (20 mg total) by mouth daily. 03/23/16   Alvira MondayErin Cross Jorge, MD    Family History No family history on file.  Social History Social History  Substance Use Topics  . Smoking status: Never Smoker  . Smokeless tobacco: Never Used  . Alcohol use No     Allergies   Patient has no known allergies.   Review of Systems Review of Systems  Constitutional: Negative for fever.  HENT: Negative for sore throat.   Eyes: Positive for  photophobia, pain, discharge, redness and visual disturbance.  Respiratory: Negative for shortness of breath.   Cardiovascular: Negative for chest pain.  Gastrointestinal: Negative for abdominal pain.  Genitourinary: Negative for difficulty urinating.  Musculoskeletal: Negative for back pain and neck stiffness.  Skin: Negative for rash.  Neurological: Negative for syncope and headaches.     Physical Exam Updated Vital Signs BP 167/97   Pulse 82   Temp 98.4 F (36.9 C) (Oral)   Resp 18   Ht 5\' 6"  (1.676 m)   Wt 215 lb (97.5 kg)   SpO2 97%   BMI 34.70 kg/m   Physical Exam  Constitutional: He is oriented to person, place, and time. He appears well-developed and well-nourished. No distress.  HENT:  Head: Normocephalic and atraumatic.  Eyes: EOM are normal. Pupils are equal, round, and reactive to light. Right conjunctiva is injected. Right conjunctiva has no hemorrhage. Right eye exhibits normal extraocular motion. Left eye exhibits normal extraocular motion. Right pupil is round and reactive. Left pupil is round and reactive. Pupils are equal.  Slit lamp exam:      The right eye shows no fluorescein uptake.       The left eye shows no fluorescein uptake.  Visual acuity 20/70 L eye, 20/200 R eye IOP 19 R eye and 21 in L eye  Neck: Normal range of motion.  Cardiovascular:  Normal rate, regular rhythm, normal heart sounds and intact distal pulses.  Exam reveals no gallop and no friction rub.   No murmur heard. Pulmonary/Chest: Effort normal and breath sounds normal. No respiratory distress. He has no wheezes. He has no rales.  Abdominal: Soft. He exhibits no distension. There is no tenderness. There is no guarding.  Musculoskeletal: He exhibits no edema.  Neurological: He is alert and oriented to person, place, and time.  Skin: Skin is warm and dry. He is not diaphoretic.  Nursing note and vitals reviewed.    ED Treatments / Results  Labs (all labs ordered are listed, but  only abnormal results are displayed) Labs Reviewed - No data to display  EKG  EKG Interpretation None       Radiology Ct Orbits Wo Contrast  Result Date: 03/23/2016 CLINICAL DATA:  Acute right eye pain. EXAM: CT ORBITS WITHOUT CONTRAST TECHNIQUE: Multidetector CT images were obtained using the standard protocol without intravenous contrast. COMPARISON:  CT scan of December 23, 2014. FINDINGS: Orbits: No traumatic or inflammatory finding. Globes, optic nerves, orbital fat, extraocular muscles, vascular structures, and lacrimal glands are normal. Visualized sinuses: Clear. Soft tissues: Negative. Limited intracranial: No significant or unexpected finding. IMPRESSION: No definite abnormality seen in the orbits. No radiopaque foreign body is noted. Electronically Signed   By: Lupita Raider, M.D.   On: 03/23/2016 11:17    Procedures Procedures (including critical care time)  Medications Ordered in ED Medications  Tdap (BOOSTRIX) injection 0.5 mL (0.5 mLs Intramuscular Given 03/23/16 0801)  tetracaine (PONTOCAINE) 0.5 % ophthalmic solution 2 drop (2 drops Both Eyes Given 03/23/16 0800)  fluorescein ophthalmic strip 1 strip (1 strip Both Eyes Given 03/23/16 0800)  LORazepam (ATIVAN) tablet 1 mg (1 mg Oral Given 03/23/16 0921)  lisinopril (PRINIVIL,ZESTRIL) tablet 20 mg (20 mg Oral Given 03/23/16 1153)     Initial Impression / Assessment and Plan / ED Course  I have reviewed the triage vital signs and the nursing notes.  Pertinent labs & imaging results that were available during my care of the patient were reviewed by me and considered in my medical decision making (see chart for details).  Clinical Course    51yo male with history of hypertension presents with concern for possible eye foreign body and pain. On arrival to the ED, BP was elevated in the ED in setting of not taking his medications over the last week--patient without headache, no neurologic symptoms, no chest pain, no shortness of  breath and have low suspicion for hypertensive emergencies including low suspicion for [facility], hypertensive encephalopathy, stroke, MI, aortic dissection, pulmonary edema.   Provided patient with dose of home medication lisinopril. Discussed importance of close primary care follow up and reasons to return to the ED in detail.  Patient concerned regarding glass shattering and getting into his eye. Has history of presentation for similar in October 2016.  This time, no my visual acuity exam, he is significantly worse, bilaterally and with significant changes in the vision in right eye--however, I do not see any fluorescein uptake on exam, no sign of corneal abrasion, pt with normal pupils, normal EOM, no hyphema. CT orbits obtained without signs of foreign body. Normal IOP bilaterally.  Patient hypertensive, however by history of foreign body leading to symptoms I do not feel his visual symptoms are hypertensive in nature.   Discussed with Dr. Sherryll Burger given signficant visual changes and patient to be evaluated today.  Case management setting appt with PCP and  will provide refill of lisinopril to last through this time.   Patient discharged in stable condition with understanding of reasons to return.    Final Clinical Impressions(s) / ED Diagnoses   Final diagnoses:  Acute right eye pain  Visual changes  Essential hypertension    New Prescriptions Discharge Medication List as of 03/23/2016 12:18 PM       Alvira Monday, MD 03/24/16 0001

## 2016-04-05 ENCOUNTER — Ambulatory Visit: Payer: Self-pay | Admitting: Family Medicine

## 2016-05-27 ENCOUNTER — Encounter (HOSPITAL_COMMUNITY): Payer: Self-pay | Admitting: Nurse Practitioner

## 2016-05-27 ENCOUNTER — Emergency Department (HOSPITAL_COMMUNITY)
Admission: EM | Admit: 2016-05-27 | Discharge: 2016-05-28 | Disposition: A | Discharge status: Home / Self Care | Payer: Self-pay | Attending: Emergency Medicine | Admitting: Emergency Medicine

## 2016-05-27 DIAGNOSIS — R51 Headache: Secondary | ICD-10-CM | POA: Insufficient documentation

## 2016-05-27 DIAGNOSIS — R5383 Other fatigue: Secondary | ICD-10-CM

## 2016-05-27 DIAGNOSIS — Z79899 Other long term (current) drug therapy: Secondary | ICD-10-CM | POA: Insufficient documentation

## 2016-05-27 DIAGNOSIS — I10 Essential (primary) hypertension: Secondary | ICD-10-CM | POA: Insufficient documentation

## 2016-05-27 DIAGNOSIS — R531 Weakness: Secondary | ICD-10-CM | POA: Insufficient documentation

## 2016-05-27 DIAGNOSIS — G44209 Tension-type headache, unspecified, not intractable: Secondary | ICD-10-CM

## 2016-05-27 LAB — CBC WITH DIFFERENTIAL/PLATELET
BASOS PCT: 0 %
Basophils Absolute: 0 10*3/uL (ref 0.0–0.1)
EOS ABS: 0.1 10*3/uL (ref 0.0–0.7)
Eosinophils Relative: 2 %
HCT: 40.6 % (ref 39.0–52.0)
Hemoglobin: 14.7 g/dL (ref 13.0–17.0)
Lymphocytes Relative: 48 %
Lymphs Abs: 2.8 10*3/uL (ref 0.7–4.0)
MCH: 31.5 pg (ref 26.0–34.0)
MCHC: 36.2 g/dL — AB (ref 30.0–36.0)
MCV: 87.1 fL (ref 78.0–100.0)
MONOS PCT: 6 %
Monocytes Absolute: 0.3 10*3/uL (ref 0.1–1.0)
NEUTROS ABS: 2.5 10*3/uL (ref 1.7–7.7)
Neutrophils Relative %: 44 %
PLATELETS: 227 10*3/uL (ref 150–400)
RBC: 4.66 MIL/uL (ref 4.22–5.81)
RDW: 12.4 % (ref 11.5–15.5)
WBC: 5.8 10*3/uL (ref 4.0–10.5)

## 2016-05-27 LAB — BASIC METABOLIC PANEL
Anion gap: 8 (ref 5–15)
BUN: 20 mg/dL (ref 6–20)
CALCIUM: 9.1 mg/dL (ref 8.9–10.3)
CO2: 27 mmol/L (ref 22–32)
Chloride: 101 mmol/L (ref 101–111)
Creatinine, Ser: 1.29 mg/dL — ABNORMAL HIGH (ref 0.61–1.24)
GFR calc Af Amer: >60 mL/min (ref >60)
GLUCOSE: 161 mg/dL — AB (ref 65–99)
POTASSIUM: 3.4 mmol/L — AB (ref 3.5–5.1)
SODIUM: 136 mmol/L (ref 135–145)

## 2016-05-27 LAB — CBG MONITORING, ED: Glucose-Capillary: 161 mg/dL — ABNORMAL HIGH (ref 65–99)

## 2016-05-27 NOTE — ED Notes (Signed)
ED Provider at bedside. 

## 2016-05-27 NOTE — ED Provider Notes (Signed)
[facility]-EMERGENCY DEPT Provider Note   CSN: [id number] Arrival date & time: 05/27/16  2151   By signing my name below, I, Talbert Nan, attest that this documentation has been prepared under the direction and in the presence of [employer], PA-C. Electronically Signed: Talbert Nan, Scribe. 05/28/16. 12:06 AM.    History   Chief Complaint Chief Complaint  Patient presents with  . Fatigue  . Headache    HPI Xavier Norman is a 51 y.o. male who presents to the Emergency Department complaining of 10/10 severity, worsening headache that began 3 days ago. Pt has associated blurry vision, neck pain, weakness, photophobia, fatigue, frequency of urination, unqenchable thirst. Pt has elevated BP of 174/93. Pt states that he has recently ran out of his antihypertensives (about 3 weeks).The patient denies chest pain, shortness of breath,  fever, cough, weakness, numbness, dizziness, anorexia, edema, abdominal pain, nausea, vomiting, diarrhea, rash, back pain, dysuria, hematemesis, bloody stool, near syncope, or syncope.  Patient did not take any medications prior to arrival for his symptoms    The history is provided by the patient. No language interpreter was used.    Past Medical History:  Diagnosis Date  . Hypertension   . Sleep apnea     There are no active problems to display for this patient.   History reviewed. No pertinent surgical history.     Home Medications    Prior to Admission medications   Medication Sig Start Date End Date Taking? Authorizing Provider  atorvastatin (LIPITOR) 20 MG tablet Take 20 mg by mouth daily.    Historical Provider, MD  lisinopril (PRINIVIL,ZESTRIL) 20 MG tablet Take 1 tablet (20 mg total) by mouth daily. 03/23/16   Alvira Monday, MD  naproxen (NAPROSYN) 500 MG tablet Take 1 tablet (500 mg total) by mouth 2 (two) times daily. 10/10/15   Renne Crigler, PA-C  oxyCODONE-acetaminophen (PERCOCET/ROXICET) 5-325 MG tablet Take 1-2 tablets by mouth every 6  (six) hours as needed for severe pain. 10/10/15   Renne Crigler, PA-C    Family History History reviewed. No pertinent family history.  Social History Social History  Substance Use Topics  . Smoking status: Never Smoker  . Smokeless tobacco: Never Used  . Alcohol use No     Allergies   Patient has no known allergies.   Review of Systems Review of Systems  Constitutional: Negative for fever.  Eyes: Positive for photophobia and visual disturbance.  Gastrointestinal: Negative for abdominal pain and vomiting.  Musculoskeletal: Positive for neck pain.  Neurological: Positive for weakness and headaches.    A complete 10 system review of systems was obtained and all systems are negative except as noted in the HPI and PMH.    Physical Exam Updated Vital Signs BP 174/93 (BP Location: Right Arm)   Pulse 79   Temp 97.7 F (36.5 C) (Oral)   Resp 20   SpO2 100%   Physical Exam  Constitutional: He is oriented to person, place, and time. He appears well-developed and well-nourished. No distress.  HENT:  Head: Normocephalic and atraumatic.  Mouth/Throat: Oropharynx is clear and moist.  Eyes: Pupils are equal, round, and reactive to light.  Neck: Normal range of motion. Neck supple.  Cardiovascular: Normal rate, regular rhythm and normal heart sounds.  Exam reveals no gallop and no friction rub.   No murmur heard. Pulmonary/Chest: Effort normal and breath sounds normal. No respiratory distress. He has no wheezes.  Abdominal: Soft. Bowel sounds are normal. He exhibits no distension. There is  no tenderness.  Neurological: He is alert and oriented to person, place, and time. He has normal strength. No sensory deficit. He exhibits normal muscle tone. Coordination and gait normal. GCS eye subscore is 4. GCS verbal subscore is 5. GCS motor subscore is 6.  Skin: Skin is warm and dry. Capillary refill takes less than 2 seconds. No rash noted. No erythema.  Psychiatric: He has a normal mood  and affect. His behavior is normal.  Nursing note and vitals reviewed.    ED Treatments / Results   DIAGNOSTIC STUDIES: Oxygen Saturation is 100% on room air, normal by my interpretation.    COORDINATION OF CARE: 11:59 PM Discussed treatment plan with pt at bedside and pt agreed to plan, which includes CT scan.    Labs (all labs ordered are listed, but only abnormal results are displayed) Labs Reviewed  BASIC METABOLIC PANEL - Abnormal; Notable for the following:       Result Value   Potassium 3.4 (*)    Glucose, Bld 161 (*)    Creatinine, Ser 1.29 (*)    All other components within normal limits  CBC WITH DIFFERENTIAL/PLATELET - Abnormal; Notable for the following:    MCHC 36.2 (*)    All other components within normal limits  CBG MONITORING, ED - Abnormal; Notable for the following:    Glucose-Capillary 161 (*)    All other components within normal limits    EKG  EKG Interpretation  Date/Time:  Friday May 27 2016 22:14:15 EST Ventricular Rate:  74 PR Interval:    QRS Duration: 119 QT Interval:  397 QTC Calculation: 441 R Axis:   -39 Text Interpretation:  Sinus rhythm Probable left atrial enlargement Incomplete RBBB and LAFB Nonspecific T abnormalities, lateral leads Borderline ST elevation, anterior leads similar appearane to may of 2017 Confirmed by [facility] MD, Barbara CowerJASON [phone]([id number]) on 05/27/2016 10:17:11 PM       Radiology No results found.  Procedures Procedures (including critical care time)  Medications Ordered in ED Medications - No data to display   Initial Impression / Assessment and Plan / ED Course  I have reviewed the triage vital signs and the nursing notes.  Pertinent labs & imaging results that were available during my care of the patient were reviewed by me and considered in my medical decision making (see chart for details).   patient is feeling some better at this time.  Patient will be given primary care follow-up.  Told to return here as  needed.  He should agrees the plan.  All questions were answered.  The patient has neck discomfort that is bilateral that seems to wrap around to the forehead and scalp.  Patient does not have any focal neurological deficits noted on exam.  Patient was able to drink.  Patient is given IV fluids.  I do not think that the headache is solely associated with blood pressure, but this could be a contributing factor    Final Clinical Impressions(s) / ED Diagnoses   Final diagnoses:  None    New Prescriptions New Prescriptions   No medications on file       Charlestine NightChristopher Ilithyia Titzer, PA-C 05/28/16 0500    Charlestine Nighthristopher Dorothy Landgrebe, PA-C 05/28/16 0500    Canary Brimhristopher J Tegeler, MD 05/28/16 [phone]

## 2016-05-27 NOTE — ED Triage Notes (Addendum)
Pt is c/o severe HA, elevated BP(he has a home BP machine) and not feeling well, blurry vision and fatigue.  Of note, pt states that he has recently ran out of his antihypertensives (about 3 weeks), is experiencing "peeing a lot, eating a lot without getting full and having unquenchable thirst."

## 2016-05-28 ENCOUNTER — Emergency Department (HOSPITAL_COMMUNITY): Payer: Self-pay

## 2016-05-28 MED ORDER — PROCHLORPERAZINE EDISYLATE 5 MG/ML IJ SOLN
10.0000 mg | Freq: Once | INTRAMUSCULAR | Status: AC
  Administered 2016-05-28: 10 mg via INTRAVENOUS
  Filled 2016-05-28: qty 2

## 2016-05-28 MED ORDER — KETOROLAC TROMETHAMINE 30 MG/ML IJ SOLN
30.0000 mg | Freq: Once | INTRAMUSCULAR | Status: AC
  Administered 2016-05-28: 30 mg via INTRAVENOUS
  Filled 2016-05-28: qty 1

## 2016-05-28 MED ORDER — SODIUM CHLORIDE 0.9 % IV BOLUS (SEPSIS)
1000.0000 mL | Freq: Once | INTRAVENOUS | Status: AC
  Administered 2016-05-28: 1000 mL via INTRAVENOUS

## 2016-05-28 MED ORDER — FENTANYL CITRATE (PF) 100 MCG/2ML IJ SOLN
100.0000 ug | Freq: Once | INTRAMUSCULAR | Status: AC
  Administered 2016-05-28: 100 ug via INTRAVENOUS
  Filled 2016-05-28: qty 2

## 2016-05-28 NOTE — Discharge Instructions (Signed)
Follow-up with the clinic provided.  Return here as needed.  Increase your fluid intake, rest as much possible

## 2016-11-07 ENCOUNTER — Emergency Department (HOSPITAL_COMMUNITY)
Admission: EM | Admit: 2016-11-07 | Discharge: 2016-11-07 | Disposition: A | Discharge status: Home / Self Care | Payer: Worker's Compensation | Attending: Emergency Medicine | Admitting: Emergency Medicine

## 2016-11-07 ENCOUNTER — Encounter (HOSPITAL_COMMUNITY): Payer: Self-pay | Admitting: Emergency Medicine

## 2016-11-07 DIAGNOSIS — R739 Hyperglycemia, unspecified: Secondary | ICD-10-CM | POA: Insufficient documentation

## 2016-11-07 DIAGNOSIS — I10 Essential (primary) hypertension: Secondary | ICD-10-CM | POA: Insufficient documentation

## 2016-11-07 DIAGNOSIS — H538 Other visual disturbances: Secondary | ICD-10-CM

## 2016-11-07 LAB — CBC
HCT: 40.9 % (ref 39.0–52.0)
Hemoglobin: 14.5 g/dL (ref 13.0–17.0)
MCH: 31.3 pg (ref 26.0–34.0)
MCHC: 35.5 g/dL (ref 30.0–36.0)
MCV: 88.3 fL (ref 78.0–100.0)
Platelets: 222 10*3/uL (ref 150–400)
RBC: 4.63 MIL/uL (ref 4.22–5.81)
RDW: 12.7 % (ref 11.5–15.5)
WBC: 4 10*3/uL (ref 4.0–10.5)

## 2016-11-07 LAB — BASIC METABOLIC PANEL
Anion gap: 9 (ref 5–15)
BUN: 14 mg/dL (ref 6–20)
CO2: 25 mmol/L (ref 22–32)
Calcium: 8.7 mg/dL — ABNORMAL LOW (ref 8.9–10.3)
Chloride: 105 mmol/L (ref 101–111)
Creatinine, Ser: 1.36 mg/dL — ABNORMAL HIGH (ref 0.61–1.24)
GFR calc Af Amer: >60 mL/min (ref >60)
GFR calc non Af Amer: 59 mL/min — ABNORMAL LOW (ref >60)
Glucose, Bld: 111 mg/dL — ABNORMAL HIGH (ref 65–99)
Potassium: 3.3 mmol/L — ABNORMAL LOW (ref 3.5–5.1)
Sodium: 139 mmol/L (ref 135–145)

## 2016-11-07 LAB — URINALYSIS, ROUTINE W REFLEX MICROSCOPIC
Bilirubin Urine: NEGATIVE
Glucose, UA: NEGATIVE mg/dL
Hgb urine dipstick: NEGATIVE
Ketones, ur: NEGATIVE mg/dL
Leukocytes, UA: NEGATIVE
Nitrite: NEGATIVE
Protein, ur: NEGATIVE mg/dL
Specific Gravity, Urine: 1.014 (ref 1.005–1.030)
pH: 5 (ref 5.0–8.0)

## 2016-11-07 LAB — CBG MONITORING, ED: Glucose-Capillary: 97 mg/dL (ref 65–99)

## 2016-11-07 MED ORDER — TETRACAINE HCL 0.5 % OP SOLN
2.0000 [drp] | Freq: Once | OPHTHALMIC | Status: DC
  Filled 2016-11-07: qty 4

## 2016-11-07 MED ORDER — DIAZEPAM 5 MG PO TABS
5.0000 mg | ORAL_TABLET | Freq: Once | ORAL | Status: DC
Start: 2016-11-07 — End: 2016-11-07

## 2016-11-07 MED ORDER — FLUORESCEIN SODIUM 0.6 MG OP STRP
1.0000 | ORAL_STRIP | Freq: Once | OPHTHALMIC | Status: DC
  Filled 2016-11-07: qty 1

## 2016-11-07 MED ORDER — LISINOPRIL 20 MG PO TABS: 20.0000 mg | ORAL_TABLET | Freq: Every day | ORAL | 2 refills | Status: DC

## 2016-11-07 MED ORDER — LISINOPRIL 20 MG PO TABS
20.0000 mg | ORAL_TABLET | Freq: Once | ORAL | Status: AC
  Administered 2016-11-07: 20 mg via ORAL
  Filled 2016-11-07: qty 1

## 2016-11-07 MED ORDER — CLONIDINE HCL 0.1 MG PO TABS
0.1000 mg | ORAL_TABLET | Freq: Once | ORAL | Status: AC
  Administered 2016-11-07: 0.1 mg via ORAL
  Filled 2016-11-07: qty 1

## 2016-11-07 NOTE — Discharge Instructions (Signed)
Go to see the eye doctor directly from here today

## 2016-11-07 NOTE — ED Provider Notes (Signed)
[facility]PT Provider Note   CSN: [id number] Arrival date & time: 11/07/16  0517     History   Chief Complaint Chief Complaint  Patient presents with  . Hypertension  . Eye Problem  . Hyperglycemia    HPI Xavier Norman is a 51 y.o. male.  51 year old male presents with one-week history of right eye blurred vision with associated mild headache. He describes it as if he is looking through a blurred piece of glass. Patient states that he has had some vision loss in that eye as well. Denies any associated emesis. Does have a history of hypertension and has not his medications for several weeks. Denies any weakness in his arms or legs. No neck pain. Denies any eye drainage or redness.denies any photophobia. Does not use contact lenses. No prior history of glaucoma. No treatment use prior to arrival.      Past Medical History:  Diagnosis Date  . Hypertension   . Sleep apnea     There are no active problems to display for this patient.   History reviewed. No pertinent surgical history.     Home Medications    Prior to Admission medications   Medication Sig Start Date End Date Taking? Authorizing Provider  lisinopril (PRINIVIL,ZESTRIL) 20 MG tablet Take 1 tablet (20 mg total) by mouth daily. Patient not taking: Reported on 05/28/2016 03/23/16   Alvira Monday, MD  naproxen (NAPROSYN) 500 MG tablet Take 1 tablet (500 mg total) by mouth 2 (two) times daily. Patient not taking: Reported on 05/28/2016 10/10/15   Renne Crigler, PA-C  oxyCODONE-acetaminophen (PERCOCET/ROXICET) 5-325 MG tablet Take 1-2 tablets by mouth every 6 (six) hours as needed for severe pain. Patient not taking: Reported on 05/28/2016 10/10/15   Renne Crigler, PA-C    Family History History reviewed. No pertinent family history.  Social History Social History  Substance Use Topics  . Smoking status: Never Smoker  . Smokeless tobacco: Never Used  . Alcohol use No     Allergies   Patient has  no known allergies.   Review of Systems Review of Systems  All other systems reviewed and are negative.    Physical Exam Updated Vital Signs BP (!) 169/115   Pulse 60   Temp 98.3 F (36.8 C) (Oral)   Resp 18   Ht 1.727 m (5\' 8" )   Wt 93 kg (205 lb)   SpO2 99%   BMI 31.17 kg/m   Physical Exam  Constitutional: He is oriented to person, place, and time. He appears well-developed and well-nourished.  Non-toxic appearance. No distress.  HENT:  Head: Normocephalic and atraumatic.  Eyes: Pupils are equal, round, and reactive to light. Conjunctivae, EOM and lids are normal. Lids are everted and swept, no foreign bodies found. Right conjunctiva is not injected. Left conjunctiva is not injected. Right eye exhibits normal extraocular motion. Left eye exhibits normal extraocular motion.  Fundoscopic exam:      The right eye shows no exudate.       The left eye shows no exudate.  No APD noted, corneas clear. Possible right nasal field deficit  Neck: Normal range of motion. Neck supple. No tracheal deviation present. No thyroid mass present.  Cardiovascular: Normal rate, regular rhythm and normal heart sounds.  Exam reveals no gallop.   No murmur heard. Pulmonary/Chest: Effort normal and breath sounds normal. No stridor. No respiratory distress. He has no decreased breath sounds. He has no wheezes. He has no rhonchi. He has no rales.  Abdominal: Soft. Normal appearance and bowel sounds are normal. He exhibits no distension. There is no tenderness. There is no rebound and no CVA tenderness.  Musculoskeletal: Normal range of motion. He exhibits no edema or tenderness.  Neurological: He is alert and oriented to person, place, and time. He has normal strength. No cranial nerve deficit or sensory deficit. GCS eye subscore is 4. GCS verbal subscore is 5. GCS motor subscore is 6.  Skin: Skin is warm and dry. No abrasion and no rash noted.  Psychiatric: He has a normal mood and affect. His speech  is normal and behavior is normal.  Nursing note and vitals reviewed.    ED Treatments / Results  Labs (all labs ordered are listed, but only abnormal results are displayed) Labs Reviewed  BASIC METABOLIC PANEL - Abnormal; Notable for the following:       Result Value   Potassium 3.3 (*)    Glucose, Bld 111 (*)    Creatinine, Ser 1.36 (*)    Calcium 8.7 (*)    GFR calc non Af Amer 59 (*)    All other components within normal limits  CBC  URINALYSIS, ROUTINE W REFLEX MICROSCOPIC  CBG MONITORING, ED    EKG  EKG Interpretation None       Radiology No results found.  Procedures Procedures (including critical care time)  Medications Ordered in ED Medications  tetracaine (PONTOCAINE) 0.5 % ophthalmic solution 2 drop (not administered)  fluorescein ophthalmic strip 1 strip (not administered)  diazepam (VALIUM) tablet 5 mg (not administered)     Initial Impression / Assessment and Plan / ED Course  I have reviewed the triage vital signs and the nursing notes.  Pertinent labs & imaging results that were available during my care of the patient were reviewed by me and considered in my medical decision making (see chart for details).     Attempted slit-lamp exam without success due to slit lamp be nonfunctional. Patient does not have any evidence of acute angle closure glaucoma. His cornea is not cloudy. Does not have a severe headache. There is no injection of his eye. Due to possible field cut as well as blurred vision concern for retinal detachment. Was also concern about central etiology or MRI of the brain which patient has deferred. Patient's blood pressure treated here in has improved. Discussed with Dr. Sherryll Burger from ophthalmology he will see the patient in the office today  Final Clinical Impressions(s) / ED Diagnoses   Final diagnoses:  None    New Prescriptions New Prescriptions   No medications on file     Lorre Nick, MD 11/07/16 1057

## 2016-12-10 ENCOUNTER — Encounter (HOSPITAL_COMMUNITY): Payer: Self-pay | Admitting: Emergency Medicine

## 2016-12-10 ENCOUNTER — Emergency Department (HOSPITAL_COMMUNITY): Payer: Worker's Compensation

## 2016-12-10 ENCOUNTER — Emergency Department (HOSPITAL_COMMUNITY)
Admission: EM | Admit: 2016-12-10 | Discharge: 2016-12-10 | Disposition: A | Discharge status: Home / Self Care | Payer: Worker's Compensation | Attending: Emergency Medicine | Admitting: Emergency Medicine

## 2016-12-10 DIAGNOSIS — I10 Essential (primary) hypertension: Secondary | ICD-10-CM | POA: Insufficient documentation

## 2016-12-10 DIAGNOSIS — R030 Elevated blood-pressure reading, without diagnosis of hypertension: Secondary | ICD-10-CM | POA: Insufficient documentation

## 2016-12-10 DIAGNOSIS — Z79899 Other long term (current) drug therapy: Secondary | ICD-10-CM | POA: Insufficient documentation

## 2016-12-10 DIAGNOSIS — J069 Acute upper respiratory infection, unspecified: Secondary | ICD-10-CM | POA: Insufficient documentation

## 2016-12-10 DIAGNOSIS — H538 Other visual disturbances: Secondary | ICD-10-CM | POA: Insufficient documentation

## 2016-12-10 LAB — GLUCOSE, CAPILLARY: GLUCOSE-CAPILLARY: 82 mg/dL (ref 65–99)

## 2016-12-10 MED ORDER — FLUORESCEIN SODIUM 0.6 MG OP STRP
1.0000 | ORAL_STRIP | Freq: Once | OPHTHALMIC | Status: AC
  Administered 2016-12-10: 1 via OPHTHALMIC
  Filled 2016-12-10: qty 1

## 2016-12-10 MED ORDER — TETRACAINE HCL 0.5 % OP SOLN
2.0000 [drp] | Freq: Once | OPHTHALMIC | Status: AC
  Administered 2016-12-10: 2 [drp] via OPHTHALMIC
  Filled 2016-12-10: qty 4

## 2016-12-10 NOTE — ED Provider Notes (Signed)
[facility] DEPT Provider Note   CSN: [id number] Arrival date & time: 12/10/16  0603     History   Chief Complaint Chief Complaint  Patient presents with  . Cough  . Eye Problem     HPI   Blood pressure (!) 181/100, pulse 64, temperature 98.3 F (36.8 C), temperature source Oral, resp. rate 18, SpO2 99 %.  Xavier Norman is a 51 y.o. male complaining of rhinorrhea, sinus congestion, sore throat, fever of 103, productive cough with pleuritic chest pain and mild shortness of breath onset 2 days ago. He denies nausea, vomiting, chest pain at rest or with exertion. He notes that he had a right sided blurred vision intermittently over the course of the last month, he was seen for similar several weeks ago and advised to have MRI but he had to care for his 57-year-old child and leave. He lives in the area but doesn't have any local care and hopes to return to [facility] for evaluation with his primary care physician. Patient has been compliant with his lisinopril. At last visit it was arranged that he would see the ophthalmologist as an outpatient records after leaving the ED but he did not go.  Past Medical History:  Diagnosis Date  . Hypertension   . Sleep apnea     There are no active problems to display for this patient.   History reviewed. No pertinent surgical history.     Home Medications    Prior to Admission medications   Medication Sig Start Date End Date Taking? Authorizing Provider  lisinopril (PRINIVIL,ZESTRIL) 20 MG tablet Take 1 tablet (20 mg total) by mouth daily. 11/07/16   Lorre Nick, MD    Family History History reviewed. No pertinent family history.  Social History Social History  Substance Use Topics  . Smoking status: Never Smoker  . Smokeless tobacco: Never Used  . Alcohol use No     Allergies   Patient has no known allergies.   Review of Systems Review of Systems  A complete review of systems was obtained and all  systems are negative except as noted in the HPI and PMH.   Physical Exam Updated Vital Signs BP (!) 172/113 (BP Location: Left Arm)   Pulse (!) 55   Temp 97.9 F (36.6 C) (Oral)   Resp 18   SpO2 100%   Physical Exam  Constitutional: He appears well-developed and well-nourished.  HENT:  Head: Normocephalic.  Right Ear: External ear normal.  Left Ear: External ear normal.  Mouth/Throat: Oropharynx is clear and moist. No oropharyngeal exudate.  No drooling or stridor. Posterior pharynx mildly erythematous no significant tonsillar hypertrophy. No exudate. Soft palate rises symmetrically. No TTP or induration under tongue.   No tenderness to palpation of frontal or bilateral maxillary sinuses.  Mild mucosal edema in the nares with scant rhinorrhea.   Bilateral tympanic membranes with normal architecture and good light reflex.    Eyes: Pupils are equal, round, and reactive to light. Conjunctivae and EOM are normal.  Patient cannot count fingers accurately out of the right eye  OD 95% CI  No abnormal uptake on fluorescein stain, no cells or flare on slit lamp exam.  Neck: Normal range of motion. Neck supple.  Cardiovascular: Normal rate and regular rhythm.   Pulmonary/Chest: Effort normal and breath sounds normal. No stridor. No respiratory distress. He has no wheezes. He has no rales. He exhibits no tenderness.  Abdominal: Soft. There is no tenderness. There is no  rebound and no guarding.  Nursing note and vitals reviewed.    ED Treatments / Results  Labs (all labs ordered are listed, but only abnormal results are displayed) Labs Reviewed  GLUCOSE, CAPILLARY  CBG MONITORING, ED    EKG  EKG Interpretation None       Radiology Dg Chest 2 View  Result Date: 12/10/2016 CLINICAL DATA:  Productive cough and fever EXAM: CHEST  2 VIEW COMPARISON:  07/31/2015 FINDINGS: The heart size and mediastinal contours are within normal limits. Both lungs are clear. The  visualized skeletal structures are unremarkable. IMPRESSION: No active cardiopulmonary disease. Electronically Signed   By: Alcide Clever M.D.   On: 12/10/2016 07:25   Ct Head Wo Contrast  Result Date: 12/10/2016 CLINICAL DATA:  Hypertension and blurry vision EXAM: CT HEAD WITHOUT CONTRAST TECHNIQUE: Contiguous axial images were obtained from the base of the skull through the vertex without intravenous contrast. COMPARISON:  05/28/2016 FINDINGS: Brain: No evidence of acute infarction, hemorrhage, hydrocephalus, extra-axial collection or mass lesion/mass effect. Vascular: No hyperdense vessel or unexpected calcification. Skull: Normal. Negative for fracture or focal lesion. Sinuses/Orbits: No acute finding. Other: None. IMPRESSION: No acute intracranial abnormality noted. Electronically Signed   By: Alcide Clever M.D.   On: 12/10/2016 09:14    Procedures Procedures (including critical care time)  Medications Ordered in ED Medications  tetracaine (PONTOCAINE) 0.5 % ophthalmic solution 2 drop (2 drops Right Eye Given 12/10/16 1057)  fluorescein ophthalmic strip 1 strip (1 strip Both Eyes Given 12/10/16 1057)     Initial Impression / Assessment and Plan / ED Course  I have reviewed the triage vital signs and the nursing notes.  Pertinent labs & imaging results that were available during my care of the patient were reviewed by me and considered in my medical decision making (see chart for details).     Vitals:   12/10/16 0624 12/10/16 1013  BP: (!) 181/100 (!) 172/113  Pulse: 64 (!) 55  Resp: 18 18  Temp: 98.3 F (36.8 C) 97.9 F (36.6 C)  TempSrc: Oral Oral  SpO2: 99% 100%    Medications  tetracaine (PONTOCAINE) 0.5 % ophthalmic solution 2 drop (2 drops Right Eye Given 12/10/16 1057)  fluorescein ophthalmic strip 1 strip (1 strip Both Eyes Given 12/10/16 1057)    Xavier Norman is 51 y.o. male presenting with Sinus pressure, rhinorrhea, cough and chest congestion onset yesterday he has  an associated fever home however he is afebrile here nontoxic-appearing, blood pressure mildly elevated, he states he been compliant with his lisinopril. Chest x-ray clear, no signs of a bacterial sinusitis based on physical exam. He is reporting blurred vision on the right eye which she's had for an extended period of time. Intraocular pressure is within normal limits, head CT negative, I'm unable to obtain MRI at this time. Patient was advised to follow with ophthalmology however he did not do so, I have again given him referral to ophthalmology on this, I consulted case management to try to arrange for primary care for him, we've had an extensive discussion of return precautions and patient verbalizes understanding and teach back technique.  Evaluation does not show pathology that would require ongoing emergent intervention or inpatient treatment. Pt is hemodynamically stable and mentating appropriately. Discussed findings and plan with patient/guardian, who agrees with care plan. All questions answered. Return precautions discussed and outpatient follow up given.      Final Clinical Impressions(s) / ED Diagnoses   Final diagnoses:  Viral upper  respiratory tract infection  Blurred vision, right eye  Elevated blood pressure reading    New Prescriptions New Prescriptions   No medications on file     Kaylyn Lim 12/10/16 1125    Raeford Razor, MD 12/10/16 1135

## 2016-12-10 NOTE — Progress Notes (Addendum)
Discharge Planning: NCM reviewed chart and did provide a contact number for pt to contact [facility] or [facility] to establish with a PCP. Pt is noted to be under Generic Worker's Comp and has to follow up with physician handling his case. NCM spoke to pt and provided pt with information on [facility]. He will call to arrange appt.  Isidoro Donning RN CCM Case Mgmt phone [phone]

## 2016-12-10 NOTE — ED Triage Notes (Signed)
Pt reports productive cough with fever last PM of 103.1 but has no fever on arrival today pt reports cough but none noted at this time. Denies taking OTC meds but has increased fluid intake

## 2016-12-10 NOTE — Discharge Instructions (Signed)
Do not hesitate to return to the Emergency Department for any new, worsening or concerning symptoms.  ° °If you do not have a primary care doctor you can establish one at the  ° °[facility]: °[address] °[place] [address] °[phone] ° °After you establish care. Let them know you were seen in the emergency room. They must obtain records for further management.  ° ° °

## 2017-01-26 ENCOUNTER — Inpatient Hospital Stay (HOSPITAL_COMMUNITY)
Admission: EM | Admit: 2017-01-26 | Discharge: 2017-01-30 | DRG: 918 | Disposition: A | Discharge status: Home / Self Care | Payer: Self-pay | Attending: Family Medicine | Admitting: Family Medicine

## 2017-01-26 ENCOUNTER — Emergency Department (HOSPITAL_COMMUNITY): Payer: Self-pay

## 2017-01-26 ENCOUNTER — Encounter (HOSPITAL_COMMUNITY): Payer: Self-pay

## 2017-01-26 DIAGNOSIS — F1410 Cocaine abuse, uncomplicated: Secondary | ICD-10-CM | POA: Diagnosis present

## 2017-01-26 DIAGNOSIS — N182 Chronic kidney disease, stage 2 (mild): Secondary | ICD-10-CM | POA: Diagnosis present

## 2017-01-26 DIAGNOSIS — R0789 Other chest pain: Secondary | ICD-10-CM | POA: Diagnosis present

## 2017-01-26 DIAGNOSIS — N179 Acute kidney failure, unspecified: Secondary | ICD-10-CM | POA: Diagnosis present

## 2017-01-26 DIAGNOSIS — T405X1A Poisoning by cocaine, accidental (unintentional), initial encounter: Principal | ICD-10-CM | POA: Diagnosis present

## 2017-01-26 DIAGNOSIS — G589 Mononeuropathy, unspecified: Secondary | ICD-10-CM | POA: Diagnosis present

## 2017-01-26 DIAGNOSIS — R079 Chest pain, unspecified: Secondary | ICD-10-CM | POA: Diagnosis present

## 2017-01-26 DIAGNOSIS — Z9119 Patient's noncompliance with other medical treatment and regimen: Secondary | ICD-10-CM

## 2017-01-26 DIAGNOSIS — I129 Hypertensive chronic kidney disease with stage 1 through stage 4 chronic kidney disease, or unspecified chronic kidney disease: Secondary | ICD-10-CM | POA: Diagnosis present

## 2017-01-26 DIAGNOSIS — R51 Headache: Secondary | ICD-10-CM | POA: Diagnosis present

## 2017-01-26 DIAGNOSIS — G4730 Sleep apnea, unspecified: Secondary | ICD-10-CM | POA: Diagnosis present

## 2017-01-26 DIAGNOSIS — E876 Hypokalemia: Secondary | ICD-10-CM | POA: Diagnosis not present

## 2017-01-26 DIAGNOSIS — I160 Hypertensive urgency: Secondary | ICD-10-CM | POA: Diagnosis present

## 2017-01-26 LAB — BASIC METABOLIC PANEL
ANION GAP: 9 (ref 5–15)
BUN: 14 mg/dL (ref 6–20)
CO2: 24 mmol/L (ref 22–32)
Calcium: 9.8 mg/dL (ref 8.9–10.3)
Chloride: 106 mmol/L (ref 101–111)
Creatinine, Ser: 1.45 mg/dL — ABNORMAL HIGH (ref 0.61–1.24)
GFR, EST NON AFRICAN AMERICAN: 55 mL/min — AB (ref >60)
Glucose, Bld: 93 mg/dL (ref 65–99)
POTASSIUM: 4 mmol/L (ref 3.5–5.1)
SODIUM: 139 mmol/L (ref 135–145)

## 2017-01-26 LAB — CBC
HEMATOCRIT: 43 % (ref 39.0–52.0)
HEMOGLOBIN: 15.3 g/dL (ref 13.0–17.0)
MCH: 32 pg (ref 26.0–34.0)
MCHC: 35.6 g/dL (ref 30.0–36.0)
MCV: 90 fL (ref 78.0–100.0)
Platelets: 233 10*3/uL (ref 150–400)
RBC: 4.78 MIL/uL (ref 4.22–5.81)
RDW: 12.8 % (ref 11.5–15.5)
WBC: 13.3 10*3/uL — AB (ref 4.0–10.5)

## 2017-01-26 LAB — I-STAT TROPONIN, ED: TROPONIN I, POC: 0.03 ng/mL (ref 0.00–0.08)

## 2017-01-26 MED ORDER — SODIUM CHLORIDE 0.9 % IV BOLUS (SEPSIS)
1000.0000 mL | Freq: Once | INTRAVENOUS | Status: AC
  Administered 2017-01-26: 1000 mL via INTRAVENOUS

## 2017-01-26 MED ORDER — ASPIRIN 81 MG PO CHEW
324.0000 mg | CHEWABLE_TABLET | Freq: Once | ORAL | Status: AC
  Administered 2017-01-26: 324 mg via ORAL
  Filled 2017-01-26: qty 4

## 2017-01-26 MED ORDER — CLONIDINE HCL 0.2 MG PO TABS
0.2000 mg | ORAL_TABLET | Freq: Once | ORAL | Status: AC
  Administered 2017-01-27: 0.2 mg via ORAL
  Filled 2017-01-26: qty 1

## 2017-01-26 MED ORDER — LORAZEPAM 2 MG/ML IJ SOLN
0.5000 mg | Freq: Once | INTRAMUSCULAR | Status: AC
  Administered 2017-01-26: 0.5 mg via INTRAVENOUS
  Filled 2017-01-26: qty 1

## 2017-01-26 MED ORDER — IOPAMIDOL (ISOVUE-300) INJECTION 61%
INTRAVENOUS | Status: AC
  Administered 2017-01-26: 100 mL
  Filled 2017-01-26: qty 100

## 2017-01-26 NOTE — ED Provider Notes (Signed)
[facility] Provider Note CSN: [id number] Arrival date & time: 01/26/17  1830 History   Chief Complaint Chief Complaint  Patient presents with  . Chest Pain   HPI Corwin LevinsWilliam Arbogast is a 51 y.o. male.  The history is provided by the patient.  Chest Pain   This is a new problem. The current episode started 6 to 12 hours ago. The problem occurs constantly. The problem has not changed since onset.The pain is associated with rest and exertion. The pain is present in the substernal region and lateral region. The pain is mild. The quality of the pain is described as pressure-like. The pain does not radiate. Duration of episode(s) is 6 hours. Associated symptoms include shortness of breath. Pertinent negatives include no abdominal pain, no back pain, no cough, no fever, no palpitations and no vomiting. He has tried nothing for the symptoms. The treatment provided no relief. Risk factors include male gender and substance abuse (patient states he was using crack cocaine earlier today, upon further questioning patient admits to swallowing small bag full of cocaine).  His past medical history is significant for hypertension.  Pertinent negatives for past medical history include no seizures.   Past Medical History:  Diagnosis Date  . Hypertension   . Sleep apnea    There are no active problems to display for this patient.  History reviewed. No pertinent surgical history.  Home Medications    Prior to Admission medications   Medication Sig Start Date End Date Taking? Authorizing Provider  lisinopril (PRINIVIL,ZESTRIL) 20 MG tablet Take 1 tablet (20 mg total) by mouth daily. 11/07/16  Yes Lorre NickAllen, Anthony, MD   Family History History reviewed. No pertinent family history.  Social History Social History   Tobacco Use  . Smoking status: Never Smoker  . Smokeless tobacco: Never Used  Substance Use Topics  . Alcohol use: No  . Drug use: No   Allergies     Patient has no known allergies.  Review of Systems Review of Systems  Constitutional: Negative for chills and fever.  HENT: Negative for ear pain and sore throat.   Eyes: Negative for pain and visual disturbance.  Respiratory: Positive for shortness of breath. Negative for cough.   Cardiovascular: Positive for chest pain. Negative for palpitations.  Gastrointestinal: Negative for abdominal pain and vomiting.  Genitourinary: Negative for dysuria and hematuria.  Musculoskeletal: Negative for arthralgias and back pain.  Skin: Negative for color change and rash.  Neurological: Negative for seizures and syncope.  All other systems reviewed and are negative.  Physical Exam Updated Vital Signs BP (!) 186/116   Pulse 92   Temp 99.4 F (37.4 C) (Oral)   Resp 17   Ht 5\' 8"  (1.727 m)   Wt 93.4 kg (206 lb)   SpO2 98%   BMI 31.32 kg/m   Physical Exam  Constitutional: He appears well-developed and well-nourished.  Non-toxic appearance. He does not appear ill. No distress.  HENT:  Head: Normocephalic and atraumatic.  Eyes: Conjunctivae and EOM are normal. Pupils are equal, round, and reactive to light.  Neck: Normal range of motion. Neck supple.  Cardiovascular: Normal rate and regular rhythm.  No murmur heard. Pulmonary/Chest: Effort normal and breath sounds normal. No respiratory distress. He has no decreased breath sounds.  Abdominal: Soft. He exhibits no distension. There is no tenderness.  Musculoskeletal: Normal range of motion. He exhibits no edema.       Right lower leg: He exhibits no tenderness and  no edema.       Left lower leg: He exhibits no tenderness and no edema.  Neurological: He is alert.  Skin: Skin is warm and dry.  Psychiatric: He has a normal mood and affect.  Nursing note and vitals reviewed.  ED Treatments / Results  Labs (all labs ordered are listed, but only abnormal results are displayed) Labs Reviewed  BASIC METABOLIC PANEL - Abnormal; Notable for the  following components:      Result Value   Creatinine, Ser 1.45 (*)    GFR calc non Af Amer 55 (*)    All other components within normal limits  CBC - Abnormal; Notable for the following components:   WBC 13.3 (*)    All other components within normal limits  I-STAT TROPONIN, ED   EKG  EKG Interpretation None      Radiology Dg Chest 2 View  Result Date: 01/26/2017 CLINICAL DATA:  Chest pain EXAM: CHEST  2 VIEW COMPARISON:  12/10/2016 FINDINGS: The heart size and mediastinal contours are within normal limits. Both lungs are clear. The visualized skeletal structures are unremarkable. IMPRESSION: No active cardiopulmonary disease. Electronically Signed   By: Jasmine PangKim  Fujinaga M.D.   On: 01/26/2017 19:07   Procedures Procedures (including critical care time)  Medications Ordered in ED Medications  aspirin chewable tablet 324 mg (324 mg Oral Given 01/26/17 2045)   Initial Impression / Assessment and Plan / ED Course  I have reviewed the triage vital signs and the nursing notes.  Pertinent labs & imaging results that were available during my care of the patient were reviewed by me and considered in my medical decision making (see chart for details).  Corwin LevinsWilliam Krysiak is a 51 y.o. male who present with chest pain after cocaine use today. 324mg  of ASA provided in the ED  EKG I obtained reveals no anatomical ischemia representing STEMI, New-onset Arrhythmia, or ischemic equivalent. Therefore do not suspect ACS at this time. No concerns for Pericardial Tamponade on EKG and in light of patients hemodynamic stability. No pain related to supine or prone positions and given EKG doubt Pericarditis.  CXR unremarkable for focal airspace disease, patient is afebrile,  cough, and WBC shows no leukocytosis, do not suspect Pneumonia. Without evidence of Pneumothorax. CXR without concern for Esophageal Tear and there is no recent intractable emesis or esophageal instrumentation. No peritonitis or free air on  CXR worrisome for Perforated Abdominal Viscous.  Pain is not described as tearing and does not radiate to back, doubt Aortic Dissection. Pulses present bilaterally in upper and lower extremities. CXR does not show widened mediastinum.  Upon patient's admission of swallowing cocaine baggy CT abdomen pelvis was ordered: No CT evidence for acute intra-abdominal or pelvic abnormality.  Discussed case with radiologist who states they were unable to visualize anything in the stomach or bowel.   At this time due to continued cocaine chest pain and concerned for swallowed cocaine considered admission for further observation reasonable.   Discussed this patient's case with the accepting admitting physician.   Patient admitted in stable condition.   Final Clinical Impressions(s) / ED Diagnoses   Final diagnoses:  Chest pain, unspecified type  Cocaine abuse [facility](HCC)    ED Discharge Orders    None       Lamont SnowballGarza, Naina Sleeper, MD 01/27/17 0017    Abelino DerrickMackuen, Courteney Lyn, MD 01/31/17 [phone]

## 2017-01-26 NOTE — ED Notes (Signed)
Patient transported to CT 

## 2017-01-26 NOTE — ED Triage Notes (Signed)
Pt presents with sudden onset of mid-sternal chest pain.  Pt reports crack cocaine use, last before pain began.  Pt reports shortness of breath, reports pain has been constant and does not radiate.  Pt also requesting detox from crack.

## 2017-01-27 ENCOUNTER — Other Ambulatory Visit: Payer: Self-pay

## 2017-01-27 ENCOUNTER — Encounter (HOSPITAL_COMMUNITY): Payer: Self-pay | Admitting: Family Medicine

## 2017-01-27 DIAGNOSIS — R0789 Other chest pain: Secondary | ICD-10-CM | POA: Diagnosis present

## 2017-01-27 DIAGNOSIS — I10 Essential (primary) hypertension: Secondary | ICD-10-CM

## 2017-01-27 DIAGNOSIS — R079 Chest pain, unspecified: Secondary | ICD-10-CM | POA: Diagnosis present

## 2017-01-27 DIAGNOSIS — F1410 Cocaine abuse, uncomplicated: Secondary | ICD-10-CM

## 2017-01-27 LAB — BASIC METABOLIC PANEL
ANION GAP: 9 (ref 5–15)
BUN: 15 mg/dL (ref 6–20)
CHLORIDE: 105 mmol/L (ref 101–111)
CO2: 23 mmol/L (ref 22–32)
CREATININE: 1.25 mg/dL — AB (ref 0.61–1.24)
Calcium: 8.7 mg/dL — ABNORMAL LOW (ref 8.9–10.3)
GFR calc non Af Amer: >60 mL/min (ref >60)
Glucose, Bld: 121 mg/dL — ABNORMAL HIGH (ref 65–99)
Potassium: 3.3 mmol/L — ABNORMAL LOW (ref 3.5–5.1)
SODIUM: 137 mmol/L (ref 135–145)

## 2017-01-27 LAB — LIPID PANEL
CHOL/HDL RATIO: 4.4 ratio
CHOLESTEROL: 153 mg/dL (ref 0–200)
HDL: 35 mg/dL — AB (ref >40)
LDL Cholesterol: 107 mg/dL — ABNORMAL HIGH (ref 0–99)
Triglycerides: 57 mg/dL (ref <150)
VLDL: 11 mg/dL (ref 0–40)

## 2017-01-27 LAB — CBC
HCT: 38.1 % — ABNORMAL LOW (ref 39.0–52.0)
HEMOGLOBIN: 13.4 g/dL (ref 13.0–17.0)
MCH: 31.7 pg (ref 26.0–34.0)
MCHC: 35.2 g/dL (ref 30.0–36.0)
MCV: 90.1 fL (ref 78.0–100.0)
PLATELETS: 205 10*3/uL (ref 150–400)
RBC: 4.23 MIL/uL (ref 4.22–5.81)
RDW: 13 % (ref 11.5–15.5)
WBC: 10.1 10*3/uL (ref 4.0–10.5)

## 2017-01-27 LAB — RAPID URINE DRUG SCREEN, HOSP PERFORMED
AMPHETAMINES: NOT DETECTED
BARBITURATES: NOT DETECTED
BENZODIAZEPINES: NOT DETECTED
Cocaine: POSITIVE — AB
Opiates: NOT DETECTED
Tetrahydrocannabinol: NOT DETECTED

## 2017-01-27 LAB — HIV ANTIBODY (ROUTINE TESTING W REFLEX): HIV Screen 4th Generation wRfx: NONREACTIVE

## 2017-01-27 LAB — TROPONIN I
Troponin I: 0.06 ng/mL (ref <0.03)
Troponin I: 0.04 ng/mL (ref <0.03)

## 2017-01-27 LAB — MRSA PCR SCREENING: MRSA BY PCR: NEGATIVE

## 2017-01-27 LAB — TSH: TSH: 0.617 u[IU]/mL (ref 0.350–4.500)

## 2017-01-27 LAB — HEMOGLOBIN A1C
HEMOGLOBIN A1C: 5.5 % (ref 4.8–5.6)
MEAN PLASMA GLUCOSE: 111.15 mg/dL

## 2017-01-27 MED ORDER — GI COCKTAIL ~~LOC~~
30.0000 mL | Freq: Four times a day (QID) | ORAL | Status: DC | PRN
  Administered 2017-01-27 (×2): 30 mL via ORAL
  Filled 2017-01-27 (×2): qty 30

## 2017-01-27 MED ORDER — LISINOPRIL 20 MG PO TABS
20.0000 mg | ORAL_TABLET | Freq: Every day | ORAL | Status: DC
  Administered 2017-01-27 – 2017-01-30 (×4): 20 mg via ORAL
  Filled 2017-01-27 (×4): qty 1

## 2017-01-27 MED ORDER — ASPIRIN 81 MG PO CHEW
324.0000 mg | CHEWABLE_TABLET | Freq: Once | ORAL | Status: AC
  Administered 2017-01-27: 324 mg via ORAL
  Filled 2017-01-27: qty 4

## 2017-01-27 MED ORDER — ACETAMINOPHEN 325 MG PO TABS
650.0000 mg | ORAL_TABLET | ORAL | Status: DC | PRN
  Administered 2017-01-27 – 2017-01-30 (×5): 650 mg via ORAL
  Filled 2017-01-27 (×6): qty 2

## 2017-01-27 MED ORDER — HYDRALAZINE HCL 20 MG/ML IJ SOLN
2.0000 mg | Freq: Four times a day (QID) | INTRAMUSCULAR | Status: DC | PRN
  Administered 2017-01-27: 2 mg via INTRAVENOUS
  Filled 2017-01-27: qty 1

## 2017-01-27 MED ORDER — ONDANSETRON HCL 4 MG/2ML IJ SOLN: 4.0000 mg | Freq: Four times a day (QID) | INTRAMUSCULAR | Status: DC | PRN

## 2017-01-27 MED ORDER — AMLODIPINE BESYLATE 10 MG PO TABS
10.0000 mg | ORAL_TABLET | Freq: Every day | ORAL | Status: DC
  Administered 2017-01-27 – 2017-01-30 (×4): 10 mg via ORAL
  Filled 2017-01-27 (×4): qty 1

## 2017-01-27 MED ORDER — ENOXAPARIN SODIUM 40 MG/0.4ML ~~LOC~~ SOLN
40.0000 mg | Freq: Every day | SUBCUTANEOUS | Status: DC
  Administered 2017-01-27 – 2017-01-30 (×4): 40 mg via SUBCUTANEOUS
  Filled 2017-01-27 (×4): qty 0.4

## 2017-01-27 MED ORDER — NITROGLYCERIN 0.4 MG SL SUBL
0.4000 mg | SUBLINGUAL_TABLET | SUBLINGUAL | Status: DC | PRN
  Administered 2017-01-27 (×2): 0.4 mg via SUBLINGUAL
  Filled 2017-01-27 (×2): qty 1

## 2017-01-27 MED ORDER — POLYETHYLENE GLYCOL 3350 17 G PO PACK
17.0000 g | PACK | Freq: Every day | ORAL | Status: DC
  Administered 2017-01-27 – 2017-01-28 (×2): 17 g via ORAL
  Filled 2017-01-27 (×2): qty 1

## 2017-01-27 MED ORDER — LORAZEPAM 2 MG/ML IJ SOLN
0.5000 mg | Freq: Three times a day (TID) | INTRAMUSCULAR | Status: DC | PRN
  Administered 2017-01-27 – 2017-01-29 (×3): 0.5 mg via INTRAVENOUS
  Filled 2017-01-27 (×3): qty 1

## 2017-01-27 MED ORDER — HYDRALAZINE HCL 20 MG/ML IJ SOLN: 2.0000 mg | INTRAMUSCULAR | Status: DC | PRN

## 2017-01-27 MED ORDER — CHARCOAL ACTIVATED PO LIQD
50.0000 g | Freq: Once | ORAL | Status: AC
  Administered 2017-01-27: 50 g via ORAL
  Filled 2017-01-27: qty 240

## 2017-01-27 MED ORDER — HYDRALAZINE HCL 20 MG/ML IJ SOLN
5.0000 mg | INTRAMUSCULAR | Status: DC | PRN
  Administered 2017-01-27: 5 mg via INTRAVENOUS
  Administered 2017-01-27: 3 mg via INTRAVENOUS
  Administered 2017-01-28 – 2017-01-29 (×4): 5 mg via INTRAVENOUS
  Filled 2017-01-27 (×5): qty 1

## 2017-01-27 NOTE — Care Management Note (Signed)
Case Management Note  Patient Details  Name: Xavier Norman MRN: [id number] Date of Birth: [birth year]  Subjective/Objective:   Chest Pain               Action/Plan: CM talked to patient about DCP; No PCP, no Medical insurance; patient is agreeable to [facility] medical care; Patient stated that he was incarcerated for 19 yrs, he was released 2002 and his mother died in 2012; Patient talked about his difficulties with coping with the loss of his mother and family issues. Emotional support given and encouragement given to stop using cocaine; [name] SW will see patient for outpatient resources for substance abuse.  Expected Discharge Date:   possibly 01/28/2017               Expected Discharge Plan:  Home/Self Care  In-House Referral:  Clinical Social Work  Discharge planning Services  CM Consult  Status of Service:  In process, will continue to follow  Reola MosherChandler, Mikal Wisman L, RN,MHA,BSN [phone] 01/27/2017, 1:39 PM

## 2017-01-27 NOTE — Discharge Summary (Signed)
[facility] Discharge Summary  Patient name: Xavier Norman Medical record number: [id number] Date of birth: [birth year] Age: 51 y.o. Gender: male Date of Admission: 01/26/2017  Date of Discharge: 01/30/2017 Admitting Physician: Moses MannersWilliam A Hensel, MD  Primary Care Provider: Patient, No Pcp Per Consultants: None  Indication for Hospitalization:  Chest pain with cocaine abuse  Discharge Diagnoses/Problem List:  Chest pain Cocaine abuse Hypertension  Disposition: Discharge home  Discharge Condition: Stable, improved  Discharge Exam:  General:NAD, laying in bed, pleasant Cardiovascular:rrr, no mrg Respiratory:CTAB, normal work of breathing Gastrointestinal:normal bowel sounds, soft, mild tenderness in all quadrants Extremities: warm and well perfused Neuro:CN II-XII grossly intact, normal sensation throughout.   Brief Hospital Course:  Patient is a 51 year old male with poorly controlled hypertension with medical noncompliance and cocaine abuse who presented with substernal chest pain after cocaine use.  Found to be hypertensive in the emergency department to SBP 170s, Cardiology consulted who noted BP will likely normalize once cocaine has cleared from system.  Reported 9 out of 10 chest pain upon arrival which improved over the course of his hospital stay with negative cardiac work up including EKG NSR, ECHO with normal EF, mild elevated trop at 0.06 that trended down. He also reports that he swallowed a balloon containing 5 g of cocaine. Poison control consulted who recommended no further workup other than monitoring for 6hours after initial ingestion and no need to wait for balloon to pass. The balloon did not pass during this admission.  COW scores during this admission remained 0. Patient also endorsed intermittent numbness in R 4/5th digits. Neurology consulted who recommended MRI brain which was negative. 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  Issues for Follow Up:  1. Patient with cocaine abuse. Continue to encourage cessation. 2. Consider following up to ensure cocaine ingestion has been passed. 3. Consider monitoring 4/5th digit numbness progression/resolution. 4. Medication changes: Increase in lisinopril to 40mg .  Significant Procedures: None  Significant Labs and Imaging:  Recent Labs  Lab 01/26/17 1856 01/27/17 0221  WBC 13.3* 10.1  HGB 15.3 13.4  HCT 43.0 38.1*  PLT 233 205   Recent Labs  Lab 01/26/17 1856 01/27/17 0221 01/28/17 0530 01/29/17 0431  NA 139 137 139 139  K 4.0 3.3* 3.9 3.7  CL 106 105 106 107  CO2 24 23 27 25   GLUCOSE 93 121* 93 92  BUN 14 15 9 9   CREATININE 1.45* 1.25* 1.19 1.20  CALCIUM 9.8 8.7* 9.1 9.0    Dg Chest 2 View  Result Date: 01/26/2017 CLINICAL DATA:  Chest pain EXAM: CHEST  2 VIEW COMPARISON:  12/10/2016 FINDINGS: The heart size and mediastinal contours are within normal limits. Both lungs are clear. The visualized skeletal structures are unremarkable. IMPRESSION: No active cardiopulmonary disease. Electronically Signed   By: Jasmine PangKim  Fujinaga M.D.   On: 01/26/2017 19:07   Ct Abdomen Pelvis W Contrast  Result Date: 01/26/2017 CLINICAL DATA:  Abdominal pain EXAM: CT ABDOMEN AND PELVIS WITH CONTRAST TECHNIQUE: Multidetector CT imaging of the abdomen and pelvis was performed using the standard protocol following bolus administration of intravenous contrast. CONTRAST:  100mL ISOVUE-300 IOPAMIDOL (ISOVUE-300) INJECTION 61% COMPARISON:  11/20/2010 FINDINGS: Lower chest: Lung bases are clear.  Mild cardiomegaly. Hepatobiliary: No focal liver abnormality is seen. No gallstones, gallbladder wall thickening, or biliary dilatation. Pancreas:  Unremarkable. No pancreatic ductal dilatation or surrounding inflammatory changes. Spleen:  Normal in size without focal abnormality. Adrenals/Urinary Tract: Adrenal glands are unremarkable. Kidneys are normal, without renal calculi, focal lesion, or hydronephrosis. Bladder is unremarkable. Stomach/Bowel: Stomach is within normal limits. Appendix appears normal. No evidence of bowel wall thickening, distention, or inflammatory changes. Vascular/Lymphatic: No significant vascular findings are present. No enlarged abdominal or pelvic lymph nodes. Reproductive: Prostate is unremarkable. Other: No abdominal wall hernia or abnormality. No abdominopelvic ascites. Musculoskeletal: Degenerative changes. No acute or suspicious bone lesion IMPRESSION: No CT evidence for acute intra-abdominal or pelvic abnormality. Electronically Signed   By: Jasmine Pang M.D.   On: 01/26/2017 23:31   Ct Entero Abd/pelvis W Contast  Result Date: 01/30/2017 CLINICAL DATA:  Swallowed rubber glove of cocaine.  Inpatient. EXAM: CT ABDOMEN AND PELVIS WITH CONTRAST (ENTEROGRAPHY) TECHNIQUE: Multidetector CT of the abdomen and pelvis during bolus administration of intravenous contrast. Negative oral contrast was given. CONTRAST:  ISOVUE-300 IOPAMIDOL (ISOVUE-300) INJECTION 61% COMPARISON:  01/26/2017 CT abdomen/ pelvis. FINDINGS: Lower chest: No significant pulmonary nodules or acute consolidative airspace disease. Hepatobiliary: Normal liver with no liver mass. Normal gallbladder with no radiopaque cholelithiasis. No biliary ductal dilatation. Pancreas: Normal, with no mass or duct dilation. Spleen: Normal size. No mass. Adrenals/Urinary Tract: Normal adrenals. Normal size kidneys. No hydronephrosis. There is a 1.3 cm complex renal cyst in the lower right kidney (series 4/ image 144) with mildly thickened internal septations with perceived septal enhancement. Additional subcentimeter hypodense renal cortical lesions are noted in both kidneys, too small to characterize. Normal bladder. Stomach/Bowel: There is heterogeneous  debris layering in the stomach with no gastric wall thickening. Normal caliber small bowel with no small bowel wall thickening. Normal appendix. Normal large bowel with no diverticulosis, large bowel wall thickening or pericolonic fat stranding. No radiopaque foreign body is seen within the bowel. Vascular/Lymphatic: Normal caliber abdominal aorta. Patent portal, splenic and renal veins. No pathologically enlarged lymph nodes in the abdomen or pelvis. Reproductive: Normal size prostate . Other: No pneumoperitoneum, ascites or focal fluid collection. Musculoskeletal: No aggressive appearing focal osseous lesions. Moderate thoracolumbar spondylosis. IMPRESSION: 1. No evidence of bowel obstruction or acute bowel inflammation. Heterogeneous debris layering in the nondistended stomach. No discrete radiopaque foreign body in the bowel. 2. Bosniak category IIF renal cyst in the lower right kidney, for which a follow-up MRI abdomen without and with IV contrast is recommended in 6 months. Electronically Signed   By: Delbert Phenix M.D.   On: 01/30/2017 07:21   Results/Tests Pending at Time of Discharge: None  Discharge Medications:  Allergies as of 01/30/2017   No Known Allergies     Medication List    TAKE these medications   amLODipine 10 MG tablet Commonly known as:  NORVASC Take 1 tablet (10 mg total) daily by mouth. Start taking on:  01/31/2017   aspirin 81 MG chewable tablet Chew 1 tablet (81 mg total) daily by mouth. Start taking on:  01/31/2017   lisinopril 40 MG tablet Commonly known as:  PRINIVIL,ZESTRIL Take 1 tablet (40 mg total) daily by mouth. Start taking on:  01/31/2017 What changed:    medication strength  how much to take      Discharge Instructions: Please refer to Patient Instructions section of EMR for full details.  Patient was counseled important signs and symptoms that should prompt return to medical care, changes in medications, dietary instructions, activity  restrictions, and follow up appointments.   Follow-Up Appointments: Follow-up  Information    [facility]. Go on 02/08/2017.   Specialty:  Internal Medicine Why:  @1 :00pm Contact information: [address] [place] [id number] [phone]         Ellwood DenseRumball, Latesia Norrington, DO 01/30/2017, 12:22 PM PGY-1, [facility]

## 2017-01-27 NOTE — Progress Notes (Signed)
Family Medicine Teaching Service Daily Progress Note Intern Pager: [phone]  Patient name: Xavier Norman Medical record number: [id number] Date of birth: 1966/02/19 Age: 51 y.o. Gender: male  Primary Care Provider: Patient, No Pcp Per  Consultants: None Code Status: Full  Assessment and Plan: Xavier Norman is a 51 y.o. male presenting with chest pain after cocaine ingestion. PMH is significant for hypertension, cocaine use disorder, detached retina s/p repair on 12/2016  Chest pain, improving:   Substernal diffuse chest pressure, 7/10 this am. Non-reproducible. Satting well on room air. EKG showed non-specific ST changes. Heart score of 5. Trop 0.03 > 0.06 > 0.04. BP 194/113 in ED.  Cocaine use yesterday, followed by 5g bag ingestion. CXR showed no active cardiopulmonary disease. Remaining labs grossly normal. Differential diagnosis cocaine-induced chest pain, ACS, GERD. Most likely cocaine induced chest pain, but also admits to burning abdominal pain.  - Repeat EKG this am, concern for ischemia given T wave inversions V4-V6 - consulted cardiology - Trend troponin, currently trending down, iSTAT 0.03 > 0.06 > 0.04  - Cardiac monitoring - Continuous pulse ox - Supplemental O2 as needed - Vital signs q4 - Tylenol prn - GI cocktail as needed  - BMP, CBC in the am  - Nitroglycerin prn  Cocaine ingestion:  Cocaine use on 11/8 around 2:00pm followed by ingestion of bag containing~5g around 4:00pm. Uses ~ 2x weekly. Denies any other substance use. Wants to stop using. Requesting cocaine detox. CT abdomen did not show evidence of cocaine in GI system. Received charcoal to reduce toxic absorption and miralax to aid in passing overnight. Strict I/Os to monitor for passage. One BM this am, no evidence of the bag. States it was in a cut piece of glove, approximately size of 1/2 a finger.  - UDS positive for cocaine only  - Activated charcoal and miralax overnight  - GI coctail as needed  - SW  consult for cocaine detox  - Strict I&O's for bag passage, no evidence thus far  - Monitor with COWS   Hypertensive Urgency: Long standing essential hypertension, uncontrolled by medications. Cocaine ingestion yesterday. BP 194/113 in ED that improved s/p clonidine, 160's systolic and 100's diastolic this am. Endorses continued blurry vision and frontal throbbing HA, and chronic double vision. Currently takes lisinopril 20 mg at home but has not been complaint. HTN likely elevated due to cocaine use in addition to long standing hypertension. No neurologic deficits noted on exam. Avoid beta blockers due to cocaine ingestion.  - continue lisinopril 20 mg, first dose this am  - monitor Cr, at baseline currently  - Continue amlodipine 10 mg, received first dose this am - hydralazine prn  - avoid betablockers - continue to monitor neuro status  Numbness in right 4/5 digits: Complains of numbness in his right 4/5th digits since chest pain onset. No other focal neuro deficits with 5/5 strength and sensation intact elsewhere. No atrophy noted. 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  -Continue to monitor for progression/resolution   AKI on CKD II: Cr 1.45 on admit, 1.25 today. Baseline 1.1-1.34. Likely mildly elevated above baseline on admission due to cocaine ingestion.  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 Endorses chronic double vision. EOMI. PEERL. Endorses new onset blurry vision from today. Unsure the cause of retinal detachment. Patient endorses it was due to high blood pressure.  - Continue to monitor neuro status, remains stable  FEN/GI: heart healthy diet Prophylaxis: lovenox  Disposition: Inpatient, awaiting improvement of chest pain and BP, hopefully retrieve cocaine bag.    Subjective:  Chest pressure is diffuse in nature, improving at 7/10 this am. 9/10 yesterday. Still  complains of frontal throbbing HA and blurry vision. Had one BM this am, did not see the bag. No longer nauseous, but complains of burning abdominal pain.   Objective: Temp:  [98.3 F (36.8 C)-99.4 F (37.4 C)] 98.3 F (36.8 C) (11/09 0204) Pulse Rate:  [66-114] 75 (11/09 0204) Resp:  [14-35] 18 (11/09 0806) BP: (149-196)/(83-116) 161/106 (11/09 0806) SpO2:  [96 %-98 %] 98 % (11/09 0806) Weight:  [201 lb 8 oz (91.4 kg)-206 lb (93.4 kg)] 201 lb 8 oz (91.4 kg) (11/09 0302) Physical Exam: General: NAD, sitting up comfortably  Cardiovascular: rrr, no mrg, chest pain not reproducable on palpation Respiratory: CTAB, normal work of breathing Gastrointestinal: normal bowel sounds, soft, mild tenderness in epigastrum/LUQ MSK: 5/5 strength in upper and lower extremities Neuro: CN II-XII grossly intact, normal sensation throughout with exception of right 4/5th digits. No atrophy noted on right hand, no pain with palpation.  Psych: appropriate affect and mood   Laboratory: Recent Labs  Lab 01/26/17 1856 01/27/17 0221  WBC 13.3* 10.1  HGB 15.3 13.4  HCT 43.0 38.1*  PLT 233 205   Recent Labs  Lab 01/26/17 1856 01/27/17 0221  NA 139 137  K 4.0 3.3*  CL 106 105  CO2 24 23  BUN 14 15  CREATININE 1.45* 1.25*  CALCIUM 9.8 8.7*  GLUCOSE 93 121*    Imaging/Diagnostic Tests: Dg Chest 2 View  Result Date: 01/26/2017 CLINICAL DATA:  Chest pain EXAM: CHEST  2 VIEW COMPARISON:  12/10/2016 FINDINGS: The heart size and mediastinal contours are within normal limits. Both lungs are clear. The visualized skeletal structures are unremarkable. IMPRESSION: No active cardiopulmonary disease. Electronically Signed   By: Jasmine PangKim  Fujinaga M.D.   On: 01/26/2017 19:07   Ct Abdomen Pelvis W Contrast  Result Date: 01/26/2017 CLINICAL DATA:  Abdominal pain EXAM: CT ABDOMEN AND PELVIS WITH CONTRAST TECHNIQUE: Multidetector CT imaging of the abdomen and pelvis was performed using the standard protocol following  bolus administration of intravenous contrast. CONTRAST:  100mL ISOVUE-300 IOPAMIDOL (ISOVUE-300) INJECTION 61% COMPARISON:  11/20/2010 FINDINGS: Lower chest: Lung bases are clear.  Mild cardiomegaly. Hepatobiliary: No focal liver abnormality is seen. No gallstones, gallbladder wall thickening, or biliary dilatation. Pancreas: Unremarkable. No pancreatic ductal dilatation or surrounding inflammatory changes. Spleen: Normal in size without focal abnormality. Adrenals/Urinary Tract: Adrenal glands are unremarkable. Kidneys are normal, without renal calculi, focal lesion, or hydronephrosis. Bladder is unremarkable. Stomach/Bowel: Stomach is within normal limits. Appendix appears normal. No evidence of bowel wall thickening, distention, or inflammatory changes. Vascular/Lymphatic: No significant vascular findings are present. No enlarged abdominal or pelvic lymph nodes. Reproductive: Prostate is unremarkable. Other: No abdominal wall hernia or abnormality. No abdominopelvic ascites. Musculoskeletal: Degenerative changes. No acute or suspicious bone lesion IMPRESSION: No CT evidence for acute intra-abdominal or pelvic abnormality. Electronically Signed   By: Jasmine PangKim  Fujinaga M.D.   On: 01/26/2017 23:31    [place] DragonNewing, Samantha, Medical Student 01/27/2017, 10:54 AM [place] [place] [facility] FPTS Intern pager: [phone], text pages welcome  I examined the patient and discussed the assessment and plan with the medical student and I agree with her documentation.  I have included my edits as necessary.  Yarelin Reichardt L. Myrtie SomanWarden, MD [facility] Resident PGY-2 01/27/2017 11:32 AM

## 2017-01-27 NOTE — H&P (Signed)
[facility] History and Physical Service Pager: [phone]  Patient name: Xavier Norman Medical record number: [id number] Date of birth: 1966/02/21 Age: 51 y.o. Gender: male  Primary Care Provider: Patient, No Pcp Per Consultants: None Code Status: Full  Chief Complaint: Chest pain  Assessment and Plan: Xavier LevinsWilliam Justiss is a 51 y.o. male presenting with chest pain after cocaine ingestion. PMH is significant for hypertension, cocaine use disorder, detached retina s/p repair on 12/2016  Chest pain  Substernal chest pain. Non-reproducible. Satting well on room air. EKG showed non-specific ST changes. Heart score of 5. Istat troponin 0.03. BP 194/113 in ED.  Cocaine use around 2:00pm followed by ingestion 1-2 g bag around 4pm. Head CT negative for acute intracranial abnormalities. CXR showed no active cardiopulmonary disease. Remaining labs grossly normal. Differential diagnosis cocaine-induced chest pain, ACS, GERD. Most likely cocaine induced chest pain, but will admitted for ACS rule out and hypertensive urgency.   - place in observation FPTS, attending Dr. Leveda AnnaHensel - EKG in AM - trend troponin - cardiac monitoring - continuous pulse ox - supplemental O2 as needed - vital signs q4 - tylenol prn - GI cocktail - BMP, CBC in AM - nitroglycerin prn  Cocaine ingestion Cocaine use around 2:00pm followed by ingestion of bag containing 1-2 g around 4:00pm. Denies any other substance use. Wants to stop using. Requesting cocaine detox. CT abdomen did not show evidence of cocaine in GI system. Given it was on 1-2 grams, likely to have passed the pylorus. Will give charcoal to reduce toxic absorption and miralax to aid in passing. Strict I/Os to monitor for passage. - UDS - activated charcoal - Miralax - GI coctail - SW consult for cocaine detox  - strict I/Os to monitor for passage of cocaine bag  Hypertensive Urgency Long standing essential hypertension,  uncontrolled by medications. Cocaine ingestion today. BP 194/113 in ED. Headache. Endorses new blurry vision, and chronic double vision. Head CT negative for intracranial bleeding or other acute abnormalities. BP did improve s/p one dose of 0.2 clonidine in ED to 184/112. He has been on three medications at a time in the past, lisinopril, HCTZ, and another he doses not remember. Currently takes lisinopril 20 mg at home but has been noncompliant. He did not take dose today. HTN likely elevated due to cocaine use in addition to long standing hypertension. No neurologic deficits noted on exam. Avoid beta blockers due to cocaine ingestion.  - continue lisinopril 20 mg - monitor Cr - start amlodipine 10 mg - hydralazine prn  - avoid betablockers - continue to monitor neuro status  AKI on CKD II Cr 1.45 on admit. Baseline 1.1-1.34. Likely mildly elevated above baseline due to cocaine ingestion.  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 Endorses chronic double vision. EOMI. PEERL. Endorses new onset blurry vision from today. Unsure the cause of retinal detachment. Patient endorses it was due to high blood pressure. No abnormalities noted on CT Head - continue to monitor neuro status  FEN/GI: heart healthy diet Prophylaxis: lovenox  Disposition: Observation   History of Present Illness:  Xavier LevinsWilliam Bulls is a 51 y.o. male presenting with chest pain after cocaine ingestion. Chest pain since around 4pm today. Little SOB. No h/o MI. Chest pain is diffuse. No injury or trauma to his chest. No cough. No diaphoresis. Pain does radiates down R arm and has had some numbness in fingers  earlier today. He denies weakness. Says he ingested 1-2 g of cocaine in a small bag earlier today around 4pm reportedly to avoid being caught with drugs by law enforcement. Last used cocaine earlier today around 2:30-3pm.  Interested in quitting cocaine. He does have some abdominal pain. Had some nausea earlier, but no vomiting. Has HTN. Takes lisinopril daily, notes poor compliance, has been taking more regularly over the last 2 months, last dose was yesterday. Was on a water pill in the past and HCTZ, Bp was fine then. Head hurts.Recent surgery R eye retina for detachment. He says it is from hypertension.    Review Of Systems: Per HPI with the following additions:   Review of Systems  Constitutional: Negative for chills and fever.  Eyes: Positive for blurred vision (new today ), double vision (chronic) and pain (R eye pain with headache).  Respiratory: Positive for shortness of breath. Negative for cough, sputum production and wheezing.   Cardiovascular: Positive for chest pain and palpitations. Negative for leg swelling.  Gastrointestinal: Positive for abdominal pain and nausea. Negative for constipation, diarrhea and vomiting.  Genitourinary: Negative for dysuria.  Neurological: Positive for headaches. Negative for dizziness, sensory change, speech change and focal weakness.  Psychiatric/Behavioral: Positive for substance abuse.    There are no active problems to display for this patient.   Past Medical History: Past Medical History:  Diagnosis Date  . Hypertension   . Sleep apnea     Past Surgical History: Past Surgical History:  Procedure Laterality Date  . EYE SURGERY     R eye retinal detachment    Social History: Social History   Tobacco Use  . Smoking status: Never Smoker  . Smokeless tobacco: Never Used  Substance Use Topics  . Alcohol use: No  . Drug use: No   Additional social history: 1 cigar every 2-3 days. No EtOH in over 1 year. Cocaine twice a week.   Please also refer to relevant sections of EMR.  Family History: Family History  Problem Relation Age of Onset  . Heart failure Mother   . Hypertension Mother   . Diabetes Mother   . Stomach cancer Father    Denies  family history of heart attack.  Allergies and Medications: No Known Allergies No current facility-administered medications on file prior to encounter.    Current Outpatient Medications on File Prior to Encounter  Medication Sig Dispense Refill  . lisinopril (PRINIVIL,ZESTRIL) 20 MG tablet Take 1 tablet (20 mg total) by mouth daily. 30 tablet 2    Objective: BP (!) 180/90   Pulse 86   Temp 99.4 F (37.4 C) (Oral)   Resp 16   Ht 5\' 8"  (1.727 m)   Wt 206 lb (93.4 kg)   SpO2 98%   BMI 31.32 kg/m  Exam: General: NAD, resting comfortably in bed Eyes: EOMI, PERRL ENTM:  MMM, no oropharyngeal lesions.  Neck: no cervical lymphadenopathy, no JVD Cardiovascular: rrr, no mrg, chest pain not reproducable on palpation Respiratory: CTAB, normal work of breathing Gastrointestinal: normal bowel sounds, soft, mild tenderness in epigastrum MSK: 5/5 strength in upper and lower extremities Derm: tatoo of hand over left chest, no lesions Neuro: CN II-XII grossly intact,  normal sensation throughout Psych: appropriate affect and mood  Labs and Imaging: CBC BMET  Recent Labs  Lab 01/26/17 1856  WBC 13.3*  HGB 15.3  HCT 43.0  PLT 233   Recent Labs  Lab 01/26/17 1856  NA 139  K 4.0  CL  106  CO2 24  BUN 14  CREATININE 1.45*  GLUCOSE 93  CALCIUM 9.8     Troponin (Point of Care Test) Recent Labs    01/26/17 1911  TROPIPOC 0.03    Dg Chest 2 View  Result Date: 01/26/2017 CLINICAL DATA:  Chest pain EXAM: CHEST  2 VIEW COMPARISON:  12/10/2016 FINDINGS: The heart size and mediastinal contours are within normal limits. Both lungs are clear. The visualized skeletal structures are unremarkable. IMPRESSION: No active cardiopulmonary disease. Electronically Signed   By: Kim  Fujinaga M.D.   On: 01/26/2017 19:07   Ct Abdomen Pelvis W Contrast  Result Date: 01/26/2017 CLINICAL DATA:  Abdominal pain EXAM: CT ABDOMEN AND PELVIS WITH CONTRAST TECHNIQUE: Multidetector CT imaging of the  abdomen and pelvis was performed using the standard protocol following bolus administration of intravenous contrast. CONTRAST:  <MEASUREME8.11[facility] 268[place]1.199950 Brickya24Clydie [id number] Surgical Center Of Northwest ArkaCox Medical Centers Meyer Orthopedic61nsasKindred Hospital [facility] At Wayne77Tex<MEASUREMENT8.11Eye Surgery Center Of Chattanoo098ArnetDominican H868[place]1.1s129 66Clydie [id number] Parish Rehabilitation [facility]11HospDhhs Phs Naihs Crownpoint Public Health Services Indian(787Tex<MEASUREMENT8.11[facility] Ho098ArnetSan Carl308[place]1.1s63 Van5Clydie [id number] [facility] Physicians Ambulatory Surgery Medical City [place]60entePima Hear92Tex<MEASUREMENT8.11Memorial Ho098ArnetHu828[place]1.1s9283 Harr14Clydie [id number] Outpatient Surgery CeReedsburg Area Med Ctr84nterCentura Health-Littleton Adventist43Tex<MEASUREMENT8.11Memorial Ho098ArnetP318[place]1.1a2 Ga90Clydie [id number] Surgery Center Of AlbuqueSouthwestern Virginia Mental Health Institute31rqueProvidence Valdez Medic44Tex<MEASUREMENT8.11Surgery Center At Health Pa098ArnetOld Town Endoscopy Dba Diges308[place]1.1i16 Ch47Clydie [id number] Outpatient SurWasc LLC Dba Wooster Ambulatory Surgery Center26veryVa Medical Center90Tex<MEASUREMENT8.11Trace Regional Ho098ArnetTer658[place]1.1e58 Campfi33Clydie [id number] Health Warren Memorial [facility]74HospMemorial Hermann Pearland31Tex<MEASUREMENT8.11[facility] Cent098ArnetChattanooga Surgery Center Dba Center For Sports 568[place]1.1e13 East Bridg22Clydie [id number] Centers For HeMad River Community Hospital67althDel Val Asc Dba The Eye Surge(20Tex<MEASUREMENT8.11[facility] Medical 098ArnetLong518[place]1.1I9299 Hil39Clydie Z2516458rTmc HeSouth Arlington Surgica Providers Inc Dba Same Day Surgicare93althSutter Medical Center Of S91Tex<MEASUREMENT8.11Haskell Memorial Ho098ArnetCentral678[place]1.1O30 Newcas22Clydie [id number] Memorial Davie Medical Center62HospDodge County(757Tex<MEASUREMENT8.11Va [facility] - Castle0488[place]1.1883 Glenwo71Clydie [id number] Spine And Laser Surgery CeSouthwestern Medical Center LLC20nterMemorial Community(908Tex<MEASUREMENT8.11[facility] Of 098ArnetWoodlands418[place]1.1P576 Broo17Clydie Z2516458rBaltimore Eye Surgical CeValley Surgery Center LP43nterMclaughlin Public Health Service Indian Heal(31Tex<MEASUREMENT8.11Cox [facility] Ho098Ar458[place]1.1e829Clydie [id number] Mexico Orthopaedic Surgery Center LP Dba [place] Orthopaedic SurgerPalmer Lutheran Health Center85y CeLargo Medic56Tex<MEASUREMENT8.11[facility] Of 098Arne368[place]1.1B216 Fieldsto11Clydie [id number] Valley Endoscopy CeKindred Hospital - Las Vegas ([facility])3nterMidmichigan Medical Cente(54Tex<MEASUREMENT8.11[facility] Ho098ArnetCorona 388[place]1.1e68 Ma57Clydie Z2516458rSurgical Center Of ConArh Our Lady Of The Way63nectProvidence Kodiak Island Medic(660Tex<MEASUREMENT8.11[facility] Jackso098ArnetHealthsouth Deaco598[place]1.1e435 West Su39Clydie Z2516458rNationwide Children'S Wooster Milltown Specialty And Surgery Center42HospSan Antonio Gastroenterology Endoscopy Cen70Tex<MEASUREMENT8.11Doctors [facility] Of Nelso098ArnetEncompass Health Rehabilita698[place]1.1i855 Ra78Clydie [id number] Sierra Ambulatory Surgery Center A Medical Corporation85Eye [place] Medic(737Tex<MEASUREMENT8.11[facility] Community Ho098Arnet458[place]1.1e404 S. S28Clydie Z2516458rWellstar Kennestone Oakb[facility] Center69HospSt Josephs Area HlthTex<MEASUREMENT8.11[facility] O098[place]1.1A536 Harv39Clydie [id number] Ambulatory Surgery CeSemmes Murphey Clinic11nterConnally Memorial Medic30Tex<MEASUREMENT8.11Va [facility] St098ArnetUniversity Of Md Cha648[place]1.1l398 Wo53Clydie [id number] Hospital At White Rock57mc MChi Health Richard Young Behavior(43Tex<MEASUREMENT8.11[facility] Endoscopy Cent098Arne508[place]1.1U1 W68Clydie [id number] Health IWalnut Hill Surgery Center69nstiAmbulatory Surgery Center Of T20Tex<MEASUREMENT8.11[place] Ho098ArnetEncompass Health Rehabil518[place]1.1t21 Ni24Clydie [id number] Specialty Hospital WarreMichiana Endoscopy Center73n CaLighthouse Care Center Of Conway A76Tex<MEASUREMENT8.11Lac/R[facility] 098ArnetAdvanthealth Ott648[place]1.1w9030 N. Lak70Clydie [id number] Hospital Of HuntingTexas Health Surgery Center Alliance10ton Newport [facility]oscopy Center 73Tex<MEASUREMENT8.11[place] Endosco0378[place]1.1827 Hanov9Clydie [id number] General Wesmark Ambulatory Surgery Center88HospNorthern Light A R Gould76Tex<MEASUREMENT8.11Sagewest Healt0228[place]1.188854 NE.29Clydie [id number] Center For Ambulatory SurMidwest Endoscopy Center LLC83geryEast Ohio Regional72Tex<MEASUREMENT8.11[place] Ho09798[place]1.1r403 62Clydie [id number] Washington Surgery CeKindred Hospital Northland91nterSaint Barnabas MedicTexa3Caro1Michel Bickersy OaklandVUE-300) INJECTION 61% COMPARISON:  11/20/2010 FINDINGS: Lower chest: Lung bases are clear.  Mild cardiomegaly. Hepatobiliary: No focal liver abnormality is seen. No gallstones, gallbladder wall thickening, or biliary dilatation. Pancreas: Unremarkable. No pancreatic ductal dilatation or surrounding inflammatory changes. Spleen: Normal in size without focal abnormality. Adrenals/Urinary Tract: Adrenal glands are unremarkable. Kidneys are normal, without renal calculi, focal lesion, or hydronephrosis. Bladder is unremarkable. Stomach/Bowel: Stomach is within normal limits. Appendix appears normal. No evidence of bowel wall thickening, distention, or inflammatory changes. Vascular/Lymphatic: No significant vascular findings are present. No enlarged abdominal or pelvic lymph nodes. Reproductive: Prostate is unremarkable. Other: No abdominal wall hernia or abnormality. No abdominopelvic ascites. Musculoskeletal: Degenerative changes. No acute or suspicious bone lesion IMPRESSION: No CT evidence for acute intra-abdominal or pelvic abnormality. Electronically Signed   By: Kim  Fujinaga M.D.   On: 01/26/2017 23:31    Thompson, Aaron B, MD 01/27/2017, 12:05 AM PGY-1, [place] [facility] FPTS Intern pager: [phone], text pages welcome  FPTS Upper-Level Resident Addendum  I have independently interviewed and examined the patient. I have discussed the above with the original author and agree with their documentation. My edits for correction/addition/clarification are in blue. Please see also any attending notes.   Ambar Raphael J Charleigh Correnti, DO PGY-2, [facility] 01/27/2017 4:35 AM  FPTS Service pager: [phone] (text pages welcome through AMION)

## 2017-01-27 NOTE — Clinical Social Work Note (Signed)
Clinical Social Work Assessment  Patient Details  Name: Xavier Norman MRN: [id number] Date of Birth: 06/12/1965  Date of referral:  01/27/17               Reason for consult:  Substance Use/ETOH Abuse                Permission sought to share information with:    Permission granted to share information::  No  Name::        Agency::     Relationship::     Contact Information:     Housing/Transportation Living arrangements for the past 2 months:  Single Family Home Source of Information:  Patient, Medical Team Patient Interpreter Needed:  None Criminal Activity/Legal Involvement Pertinent to Current Situation/Hospitalization:  No - Comment as needed Significant Relationships:  Siblings Lives with:    Do you feel safe going back to the place where you live?  Yes Need for family participation in patient care:  Yes (Comment)  Care giving concerns:  Substance abuse.   Social Worker assessment / plan:  CSW met with patient. No supports at bedside. CSW introduced role and inquired about interest in substance abuse resources. Patient confirmed and stated he lives in [place]. CSW provided packet for both outpatient and inpatient options in the [facility] and surrounding area. CSW encouraged patient to review the list and see which treatment program is a good fit for him. Patient looking for a treatment center that is in [place] because he does not have transportation and takes patient with no insurance. Patient also inquired about housing resources. CSW provided patient with list of low-income housing options, a shelter list, emergency assistance resources, and food pantries and free meals in [place]. No further concerns. CSW encouraged patient to contact CSW as needed. CSW signing off as social work intervention is no longer needed.  Employment status:  [employer] information:  Self Pay (Medicaid Pending) PT Recommendations:  Not assessed at this time Information /  Referral to community resources:  Residential Substance Abuse Treatment Options, Outpatient Substance Abuse Treatment Options, Shelter, Other (Comment Required)(Community resources, low-income housing options)  Patient/Family's Response to care:  Patient agreeable to receiving resources. Patient's sister supportive and involved in patient's care. Patient appreciated social work intervention.  Patient/Family's Understanding of and Emotional Response to Diagnosis, Current Treatment, and Prognosis:  Patient has a good understanding of the reason for admission and social work consult. Patient appears happy with hospital care.  Emotional Assessment Appearance:  Appears stated age Attitude/Demeanor/Rapport:  Other(Pleasant) Affect (typically observed):  Accepting, Appropriate, Calm, Pleasant Orientation:  Oriented to Self, Oriented to Place, Oriented to  Time, Oriented to Situation Alcohol / Substance use:  Illicit Drugs Psych involvement (Current and /or in the community):  No (Comment)  Discharge Needs  Concerns to be addressed:  Care Coordination Readmission within the last 30 days:  No Current discharge risk:  Substance Abuse Barriers to Discharge:  Continued Medical Work up   Candie Chroman, LCSW 01/27/2017, 1:30 PM

## 2017-01-27 NOTE — Progress Notes (Signed)
Patient arrived to [address] from emergency department. Patient alert and oriented. Vss. Admission assessment completed. CCMD notified. Will continue to monitor.

## 2017-01-27 NOTE — Progress Notes (Signed)
[facility] to support patient. Patient expressed that he was having family and personal domestic problems.  Patient indicated that he was trying process not being able to see his two yr son.  Pt also said that when he is released he has nowhere to go. I encouraged him to talk with CSW and Case manager. He also said he wanted assistance in being placed in a drug rehab faciliity. Provided listening and emotional support.  Venida JarvisWatlington, Oneta Sigman, [place], [facility], Pager [phone]

## 2017-01-27 NOTE — Progress Notes (Signed)
BP 198/87, PRN hydralazine given. Recheck BP was 187/84, post hydralazine. MD made aware.

## 2017-01-27 NOTE — Progress Notes (Signed)
CRITICAL VALUE ALERT  Critical Value:  Troponin 0.06  Date & Time Notied:  01/27/2017 0325  Provider Notified: Dr Janee Mornhompson  Orders Received/Actions taken: No new orders at this time.

## 2017-01-27 NOTE — ED Notes (Signed)
Pt ambulatory to restroom

## 2017-01-28 ENCOUNTER — Other Ambulatory Visit: Payer: Self-pay

## 2017-01-28 ENCOUNTER — Inpatient Hospital Stay (HOSPITAL_COMMUNITY): Payer: Self-pay

## 2017-01-28 DIAGNOSIS — R079 Chest pain, unspecified: Secondary | ICD-10-CM

## 2017-01-28 DIAGNOSIS — R0789 Other chest pain: Secondary | ICD-10-CM

## 2017-01-28 LAB — BASIC METABOLIC PANEL
ANION GAP: 6 (ref 5–15)
BUN: 9 mg/dL (ref 6–20)
CALCIUM: 9.1 mg/dL (ref 8.9–10.3)
CO2: 27 mmol/L (ref 22–32)
Chloride: 106 mmol/L (ref 101–111)
Creatinine, Ser: 1.19 mg/dL (ref 0.61–1.24)
Glucose, Bld: 93 mg/dL (ref 65–99)
Potassium: 3.9 mmol/L (ref 3.5–5.1)
Sodium: 139 mmol/L (ref 135–145)

## 2017-01-28 LAB — TROPONIN I: Troponin I: 0.03 ng/mL (ref <0.03)

## 2017-01-28 LAB — ECHOCARDIOGRAM COMPLETE
HEIGHTINCHES: 68 in
WEIGHTICAEL: 3241.6 [oz_av]

## 2017-01-28 MED ORDER — POLYETHYLENE GLYCOL 3350 17 G PO PACK
17.0000 g | PACK | Freq: Two times a day (BID) | ORAL | Status: DC
  Administered 2017-01-28 – 2017-01-30 (×4): 17 g via ORAL
  Filled 2017-01-28 (×4): qty 1

## 2017-01-28 MED ORDER — IBUPROFEN 600 MG PO TABS: 800.0000 mg | ORAL_TABLET | Freq: Two times a day (BID) | ORAL | Status: DC

## 2017-01-28 MED ORDER — METOCLOPRAMIDE HCL 10 MG PO TABS
10.0000 mg | ORAL_TABLET | Freq: Once | ORAL | Status: AC
  Administered 2017-01-28: 10 mg via ORAL
  Filled 2017-01-28: qty 1

## 2017-01-28 MED ORDER — KETOROLAC TROMETHAMINE 30 MG/ML IJ SOLN
30.0000 mg | Freq: Three times a day (TID) | INTRAMUSCULAR | Status: DC | PRN
  Administered 2017-01-29 – 2017-01-30 (×3): 30 mg via INTRAVENOUS
  Filled 2017-01-28 (×3): qty 1

## 2017-01-28 MED ORDER — ASPIRIN 81 MG PO CHEW
81.0000 mg | CHEWABLE_TABLET | Freq: Every day | ORAL | Status: DC
  Administered 2017-01-28 – 2017-01-30 (×3): 81 mg via ORAL
  Filled 2017-01-28 (×3): qty 1

## 2017-01-28 MED ORDER — PANTOPRAZOLE SODIUM 40 MG PO TBEC
40.0000 mg | DELAYED_RELEASE_TABLET | Freq: Every day | ORAL | Status: DC
  Administered 2017-01-28 – 2017-01-30 (×3): 40 mg via ORAL
  Filled 2017-01-28 (×3): qty 1

## 2017-01-28 MED ORDER — POTASSIUM CHLORIDE CRYS ER 20 MEQ PO TBCR
40.0000 meq | EXTENDED_RELEASE_TABLET | Freq: Once | ORAL | Status: AC
  Administered 2017-01-28: 40 meq via ORAL
  Filled 2017-01-28: qty 2

## 2017-01-28 NOTE — Progress Notes (Signed)
  Echocardiogram 2D Echocardiogram has been performed.  Xavier Norman 01/28/2017, 2:26 PM 

## 2017-01-28 NOTE — Progress Notes (Signed)
  Echocardiogram 2D Echocardiogram has been performed.  Xavier Norman  Xavier Norman 01/28/2017, 2:26 PM

## 2017-01-28 NOTE — Progress Notes (Signed)
Family Medicine Teaching Service Daily Progress Note Intern Pager: [phone]  Patient name: Xavier Norman Medical record number: [id number] Date of birth: 03-Oct-1965 Age: 51 y.o. Gender: male  Primary Care Provider: Patient, No Pcp Per  Consultants: None Code Status: Full  Assessment and Plan: Xavier Norman is a 51 y.o. male presenting with chest pain after cocaine ingestion. PMH is significant for hypertension, cocaine use disorder, detached retina s/p repair on 12/2016  Chest pain, ACS R/O: Improving. Substernal diffuse chest pressure mostly resolved, is endorsing a headache. Troponins 0.03 > 0.06 > 0.04 (last checked on 11/9). EKG showing t wave inversions still in leads V4-6 and II . Heart score of 5. Most likely cocaine induced chest pain vs ACS vs GERD. - Daily EKG - consulted cardiology, appreciate their assistance - Last troponin was checked yesterday, will recheck again today given EKG findings - Cardiac monitoring - Continuous pulse ox - Supplemental O2 as needed - Tylenol prn for chest pain - GI cocktail PRN - Add Protonix 40 gm daily - Daily BMP  Cocaine ingestion: Cocaine use on 11/8 around 2:00pm followed by ingestion of bag containing~5g on day of admission, still no BM. Uses ~ 2x weekly. Denies any other substance use. Wants to stop using. Requesting cocaine detox. CT abdomen did not show evidence of cocaine in GI system. s/p activated charcoal and miralax on 11/8 to aid in passing bag. CSW provided patient's with resources for rehab and housing. COWS scores 0-1.  - GI coctail PRN - Add PPI as above - Strict I&O's for bag passage - Monitor with COWS  - Increase Miralax to BID  Hypertensive Urgency: Improved. BP 160/98 this AM. 4 doses of 5 mg Hydralazine given in last 24 hours - continue lisinopril 20 mg and amlodipine 10 mg - hydralazine prn  - avoid betablockers - continue to monitor neuro status  Headache: Present since admission per patient report. No  neurological deficits. Unrelieved with Tylenol. Likely from hypertensive urgency - Toradol 30 mg IV q 8 PRN for headache - Reglan 10 mg once  Numbness in right 4/5 digits: Complains of numbness in his right 4/5th digits since chest pain onset, it is intermittent. No other focal neuro deficits with 5/5 strength and sensation intact elsewhere. No atrophy noted. 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 Endorses chronic double vision. EOMI. PEERL. Endorses new onset blurry vision from today. Unsure the cause of retinal detachment. Patient endorses it was due to high blood pressure.  - Continue to monitor neuro status, remains stable   FEN/GI: heart healthy diet Prophylaxis: lovenox  Disposition: Inpatient, awaiting cardiology recs from abnormal EKG, hopefully retrieve cocaine bag as well.   Subjective:  Chest pain has improved but is endorsing headache. Did not sleep well last night due to headache. Otherwise doing well. Tolerating PO without difficulty. No BM yet.   Objective: Temp:  [98.2 F (36.8 C)-99.4 F (37.4 C)] 98.3 F (36.8 C) (11/10 0450) Pulse Rate:  [63-98] 63 (11/10 0450) Resp:  [16-18] 18 (11/10 0450) BP: (160-198)/(75-99) 174/98 (11/10 0450) SpO2:  [97 %-100 %] 99 % (11/10 0450) Weight:  [202 lb 9.6 oz (91.9 kg)] 202 lb 9.6 oz (91.9 kg) (11/10 0450) Physical Exam: General: NAD, laying in bed, pleasant Cardiovascular: rrr, no mrg, chest pain not reproducable on palpation Respiratory: CTAB, normal work  of breathing Gastrointestinal: normal bowel sounds, soft, mild tenderness in all quadrants MSK: 5/5 strength in upper and lower extremities Neuro: CN II-XII grossly intact, normal sensation throughout.  Psych: appropriate affect and mood   Laboratory: Recent Labs  Lab  01/26/17 1856 01/27/17 0221  WBC 13.3* 10.1  HGB 15.3 13.4  HCT 43.0 38.1*  PLT 233 205   Recent Labs  Lab 01/26/17 1856 01/27/17 0221 01/28/17 0530  NA 139 137 139  K 4.0 3.3* 3.9  CL 106 105 106  CO2 24 23 27   BUN 14 15 9   CREATININE 1.45* 1.25* 1.19  CALCIUM 9.8 8.7* 9.1  GLUCOSE 93 121* 93    Imaging/Diagnostic Tests: No results found.  Beaulah DinningGambino, Ryah Cribb M, MD 01/28/2017, 8:22 AM [facility] FPTS Intern pager: [phone], text pages welcome

## 2017-01-28 NOTE — Consult Note (Signed)
Cardiology Consultation:   Patient ID: Xavier Norman; [id number]; [birth year]   Admit date: 01/26/2017 Date of Consult: 01/28/2017  Primary Care Provider: Patient, No Pcp Per Primary Cardiologist: [facility] (previously seen by Dr. Dietrich Patesothbart in 2012)   Patient Profile:   Xavier Norman is a 51 y.o. male with a hx of uncontrolled hypertension, recent R eye retine detachment s/p surgery, noncompliance and cocaine abuse who is being seen today for the evaluation of chest pain at the request of Dr. Lum BabeEniola.  Previously seen by Dr. Dietrich Patesothbart in 11/2010 for chest pain in setting of cocaine overdose. stress testing on November 24, 2010, revealing small inferior lateral wall stress-induced decreased uptake without wall motion abnormalities suspicious for inferior lateral wall inducible ischemia.  EF was calculated at 57%. Follow up cath as below: FINDINGS: 1. Hemodynamics aorta 151/94, LV 148/18. 2. Left ventriculography.  EF was 60% with normal wall motion in the     RAO projection. 3. Right coronary artery.  The right coronary artery was a dominant     vessel.  There was no angiographic coronary disease. 4. Left main.  Left main had no angiographic coronary disease. 5. Left circumflex system.  Left circumflex system had no angiographic     coronary disease. 6. LAD system.  Initially, there was slow flow down the LAD that could     be consistent with microvascular dysfunction; however, as the case     proceeded, flow normalized down the LAD.  There was no angiographic     coronary disease.  IMPRESSION:  The patient has noncardiac chest pain.  Most likely, there is some microvascular dysfunction given the slow flow initially down the LAD and that had improved by the end of the case. There is no angiographic coronary disease.  We will plan on discharging the patient home today.   History of Present Illness:   [name] projection. For chest pain after cocaine use (per note  approximately 1-2gm of cocaine). However today he told me that "I used 20mg  of cocaine". He described chest pain as tightness with severe shortness of breath. Noted hypertensive with BP of 187/106. Admitted for further evaluation. Troponin flat tread 0.06-->0.04. K of 3.3. EKG showed sinus rhythm with TWI in inferior laterally which similar to prior EKG - personally reviewed. CT of abdomen pelvis without acute abnormality.   He works as Location managermachine operator and has intermittent chest pain. She takes lisinopril intermittently.   Past Medical History:  Diagnosis Date  . Hypertension   . Sleep apnea     Past Surgical History:  Procedure Laterality Date  . EYE SURGERY     R eye retinal detachment    Inpatient Medications: Scheduled Meds: . amLODipine  10 mg Oral Daily  . aspirin  81 mg Oral Daily  . enoxaparin (LOVENOX) injection  40 mg Subcutaneous Daily  . lisinopril  20 mg Oral Daily  . metoCLOPramide  10 mg Oral Once  . pantoprazole  40 mg Oral Daily  . polyethylene glycol  17 g Oral BID   Continuous Infusions:  PRN Meds: acetaminophen, gi cocktail, hydrALAZINE, ketorolac, LORazepam, nitroGLYCERIN, ondansetron (ZOFRAN) IV  Allergies:   No Known Allergies  Social History:   Social History   Socioeconomic History  . Marital status: Single    Spouse name: Not on file  . Number of children: Not on file  . Years of education: Not on file  . Highest education level: Not on file  Social Needs  .  Financial resource strain: Not on file  . Food insecurity - worry: Not on file  . Food insecurity - inability: Not on file  . Transportation needs - medical: Not on file  . Transportation needs - non-medical: Not on file  Occupational History  . Not on file  Tobacco Use  . Smoking status: Never Smoker  . Smokeless tobacco: Never Used  Substance and Sexual Activity  . Alcohol use: No  . Drug use: No  . Sexual activity: Not on file  Other Topics Concern  . Not on file  Social  History Narrative  . Not on file    Family History:    Family History  Problem Relation Age of Onset  . Heart failure Mother   . Hypertension Mother   . Diabetes Mother   . Stomach cancer Father      ROS:  Please see the history of present illness.  ROS All other ROS reviewed and negative.     Physical Exam/Data:   Vitals:   01/27/17 2201 01/28/17 0005 01/28/17 0450 01/28/17 0823  BP: (!) 162/93 (!) 160/93 (!) 174/98 (!) 160/98  Pulse: 72 75 63   Resp:  18 18   Temp:  98.2 F (36.8 C) 98.3 F (36.8 C)   TempSrc:  Oral Oral   SpO2:  98% 99%   Weight:   202 lb 9.6 oz (91.9 kg)   Height:        Intake/Output Summary (Last 24 hours) at 01/28/2017 1105 Last data filed at 01/28/2017 0351 Gross per 24 hour  Intake 600 ml  Output 850 ml  Net -250 ml   Filed Weights   01/26/17 1836 01/27/17 0302 01/28/17 0450  Weight: 206 lb (93.4 kg) 201 lb 8 oz (91.4 kg) 202 lb 9.6 oz (91.9 kg)   Body mass index is 30.81 kg/m.  General:  Well nourished, well developed, in no acute distress HEENT: normal Lymph: no adenopathy Neck: no JVD Endocrine:  No thryomegaly Vascular: No carotid bruits; FA pulses 2+ bilaterally without bruits  Cardiac:  normal S1, S2; RRR; no murmur  Lungs:  clear to auscultation bilaterally, no wheezing, rhonchi or rales  Abd: soft, nontender, no hepatomegaly  Ext: no edema Musculoskeletal:  No deformities, BUE and BLE strength normal and equal Skin: warm and dry  Neuro:  CNs 2-12 intact, no focal abnormalities noted Psych:  Normal affect   Telemetry:  Telemetry was personally reviewed and demonstrates:  NSR  Relevant CV Studies: As above  Laboratory Data:  Chemistry Recent Labs  Lab 01/26/17 1856 01/27/17 0221 01/28/17 0530  NA 139 137 139  K 4.0 3.3* 3.9  CL 106 105 106  CO2 24 23 27   GLUCOSE 93 121* 93  BUN 14 15 9   CREATININE 1.45* 1.25* 1.19  CALCIUM 9.8 8.7* 9.1  GFRNONAA 55* >60 >60  GFRAA >60 >60 >60  ANIONGAP 9 9 6     No  results for input(s): PROT, ALBUMIN, AST, ALT, ALKPHOS, BILITOT in the last 168 hours. Hematology Recent Labs  Lab 01/26/17 1856 01/27/17 0221  WBC 13.3* 10.1  RBC 4.78 4.23  HGB 15.3 13.4  HCT 43.0 38.1*  MCV 90.0 90.1  MCH 32.0 31.7  MCHC 35.6 35.2  RDW 12.8 13.0  PLT 233 205   Cardiac Enzymes Recent Labs  Lab 01/27/17 0221 01/27/17 0658  TROPONINI 0.06* 0.04*    Recent Labs  Lab 01/26/17 1911  TROPIPOC 0.03     Radiology/Studies:  Dg Chest 2  View  Result Date: 01/26/2017 CLINICAL DATA:  Chest pain EXAM: CHEST  2 VIEW COMPARISON:  12/10/2016 FINDINGS: The heart size and mediastinal contours are within normal limits. Both lungs are clear. The visualized skeletal structures are unremarkable. IMPRESSION: No active cardiopulmonary disease. Electronically Signed   By: Jasmine Pang M.D.   On: 01/26/2017 19:07   Ct Abdomen Pelvis W Contrast  Result Date: 01/26/2017 CLINICAL DATA:  Abdominal pain EXAM: CT ABDOMEN AND PELVIS WITH CONTRAST TECHNIQUE: Multidetector CT imaging of the abdomen and pelvis was performed using the standard protocol following bolus administration of intravenous contrast. CONTRAST:  ISOVUE-300 IOPAMIDOL (ISOVUE-300) INJECTION 61% COMPARISON:  11/20/2010 FINDINGS: Lower chest: Lung bases are clear.  Mild cardiomegaly. Hepatobiliary: No focal liver abnormality is seen. No gallstones, gallbladder wall thickening, or biliary dilatation. Pancreas: Unremarkable. No pancreatic ductal dilatation or surrounding inflammatory changes. Spleen: Normal in size without focal abnormality. Adrenals/Urinary Tract: Adrenal glands are unremarkable. Kidneys are normal, without renal calculi, focal lesion, or hydronephrosis. Bladder is unremarkable. Stomach/Bowel: Stomach is within normal limits. Appendix appears normal. No evidence of bowel wall thickening, distention, or inflammatory changes. Vascular/Lymphatic: No significant vascular findings are present. No enlarged  abdominal or pelvic lymph nodes. Reproductive: Prostate is unremarkable. Other: No abdominal wall hernia or abnormality. No abdominopelvic ascites. Musculoskeletal: Degenerative changes. No acute or suspicious bone lesion IMPRESSION: No CT evidence for acute intra-abdominal or pelvic abnormality. Electronically Signed   By: Jasmine Pang M.D.   On: 01/26/2017 23:31    Assessment and Plan:   1. Chest pain with mild elevated troponin - His chest pain is cocaine induced. Troponin mild flat trend--> demand in setting of acute cocaine abuse. EKG without acute chest. Prior cath no angiographic coronary disease in 2012.   2. Hypertensive urgency - BP gradually improving. Avoid BB in setting of cocaine abuse.   3. Cocaine abuse - Advised cessation.   4. Hypokalemia - Will give Kdur x 1.     For questions or updates, please contact [facility] Please consult [email or web address] for contact info under Cardiology/STEMI.   Lorelei Pont, PA  01/28/2017 11:05 AM   Attending note Patient seen and discussed with PA Bhavinkumar, I agree with his documentation. 51 yo male history of cocaine abuse and severe HTN in the ER SBP in the 190s.    Cath 11/2010 patent vessels, possible microvascular disease UDS +cocaine, K 3.3, Cr 1.25, WBC 10.1, Hgb 13.4, TSH 0.617,  Trop 0.06-->0.04-->0.03 CXR no acute process CT A/Pno acute process EKG lateral TWI, anterior early repolarization with jpoint elevation chronic  Fairly mild troponin elevation that has trended down in the setting of +cocaine screen. Suspect presentation due to cocaine and severe HTN, do not suspect ACS at this time. Agree with norvac and lisinopril for bp. Would obtain echo, if normal then no further cardiac workup planned at this time.   Dina Rich MD

## 2017-01-28 NOTE — Progress Notes (Signed)
Pt's BP is in constantly higher side for all 12 hours shift, Hydralazine given once per prn ordered, pt does not have any complain of SOB and chest pain, asking for bed time sleeping medicine, will continue to monitor

## 2017-01-28 NOTE — Progress Notes (Signed)
FPTS Interim Progress Note  In reference to patient ingesting bag of cocaine:   Discussed case on call radiologist: Per radiologist, you will not usually see plastic on CT. Therefore we cannot say for certain where the baggy is. Additionally he stated even if we got further imaging like an enterogram or CT, we may still not see the baggy.   Discussed case with Poison Control: They stated that if patient ingested baggy, he would only need 6 hours of observation afterwards. They stated that if baggy was not tightly closed, we would've already seen the side effects. They stated that recovery of the cocaine baggy is NOT required. Additionally, his vitals are stabilizing and labs are normal which was reassuring.  Will continue to monitor patient today. Likely DC tomorrow if no further cardiologic work up.   Beaulah DinningGambino, Kayelyn Lemon M, MD 01/28/2017, 11:56 AM PGY-3, [facility] pager [phone]

## 2017-01-29 ENCOUNTER — Inpatient Hospital Stay (HOSPITAL_COMMUNITY): Payer: Self-pay

## 2017-01-29 LAB — BASIC METABOLIC PANEL
Anion gap: 7 (ref 5–15)
BUN: 9 mg/dL (ref 6–20)
CALCIUM: 9 mg/dL (ref 8.9–10.3)
CO2: 25 mmol/L (ref 22–32)
CREATININE: 1.2 mg/dL (ref 0.61–1.24)
Chloride: 107 mmol/L (ref 101–111)
GFR calc Af Amer: >60 mL/min (ref >60)
GFR calc non Af Amer: >60 mL/min (ref >60)
GLUCOSE: 92 mg/dL (ref 65–99)
Potassium: 3.7 mmol/L (ref 3.5–5.1)
SODIUM: 139 mmol/L (ref 135–145)

## 2017-01-29 MED ORDER — BARIUM SULFATE 0.1 % PO SUSP
ORAL | Status: AC
  Filled 2017-01-29: qty 3

## 2017-01-29 MED ORDER — IOPAMIDOL (ISOVUE-300) INJECTION 61%
INTRAVENOUS | Status: AC
  Administered 2017-01-29: 100 mL
  Filled 2017-01-29: qty 100

## 2017-01-29 NOTE — Plan of Care (Signed)
  Pain Managment: General experience of comfort will improve 01/29/2017 0113 - Progressing by Luther Redourgott, Chea Malan, RN

## 2017-01-29 NOTE — Progress Notes (Signed)
Patient admitted with probable cocaine related chest pain. Mild trop 0.06 that is trending down, EKG without specific changes, echo with normal LVEF and no WMAs. No further workup at this time, may titrate ACE-I further for bp.As cocaine clears bp should also trend down. We will signoff inpatient care.   Dina RichJonathan Mycheal Veldhuizen MD

## 2017-01-29 NOTE — Progress Notes (Signed)
Family Medicine Teaching Service Daily Progress Note Intern Pager: [phone]  Patient name: Xavier Norman Medical record number: [id number] Date of birth: January 04, 1966 Age: 51 y.o. Gender: male  Primary Care Provider: Patient, No Pcp Per  Consultants: None Code Status: Full  Assessment and Plan: Xavier Norman is a 51 y.o. male presenting with chest pain after cocaine ingestion. PMH is significant for hypertension, cocaine use disorder, detached retina s/p repair on 12/2016  Chest pain, ACS R/O: Improving. Substernal diffuse chest pressure mostly resolved, is endorsing a headache ACS has been ruled out. He has been seen by cardiology and was stated no ACS with EKG findings. Echo showing normal EF with G1DD. Cocaine induced chest pain is leading differential.  - stop cardiac monitoring - Stop PRN ativan - consulted cardiology, appreciate their assistance - Tylenol prn for chest pain and toradol prn - GI cocktail PRN - Protonix 40 gm daily  Cocaine ingestion: s/p activated charcoal and miralax on 11/8 to aid in passing bag. Has not passed bag yet. Discussed case with poison control, only have to monitor for 6 hours after per their recommendations - GI coctail PRN - PPI as above - Strict I&O's for bag passage - Monitor with COWS  - Miralax to BID - Repeat Abdominal CT today in search of cocaine bag  Hypertensive Urgency: Improved. 3 doses of hydralazine over last 24 hours - continue lisinopril 20 mg and amlodipine 10 mg - hydralazine prn  - avoid betablockers - continue to monitor neuro status  Headache: Present since admission per patient report. No neurological deficits. Unrelieved with Tylenol. Likely from hypertensive urgency - Toradol 30 mg IV q 8 PRN for headache - Tylenol PRN  Numbness in right 4/5 digits: Complains of numbness in his right 4/5th digits since chest pain onset, it is intermittent. No other focal neuro deficits with 5/5 strength and sensation intact  elsewhere. No atrophy noted. 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 Endorses chronic double vision. EOMI. PEERL. Endorses new onset blurry vision from today. Unsure the cause of retinal detachment. Patient endorses it was due to high blood pressure.  - Continue to monitor neuro status, remains stable   FEN/GI: heart healthy diet Prophylaxis: lovenox  Disposition: Continue to monitor, re imaging abdomen today, if negative can DC  Subjective:  Patient states he continues to have a headache and intermittent chest pain. Denies any shortness of breath or numbness. Eating well this morning  Objective: Temp:  [97.5 F (36.4 C)-98.6 F (37 C)] 98.2 F (36.8 C) (11/11 0536) Pulse Rate:  [66-75] 75 (11/11 0704) Resp:  [18-20] 18 (11/11 0536) BP: (151-180)/(77-110) 151/80 (11/11 0704) SpO2:  [97 %-98 %] 97 % (11/11 0536) Weight:  [199 lb 8 oz (90.5 kg)] 199 lb 8 oz (90.5 kg) (11/11 0536) Physical Exam: General: NAD, laying in bed, pleasant Cardiovascular: rrr, no mrg, chest pain not reproducable on palpation Respiratory: CTAB, normal work of breathing Gastrointestinal: normal bowel sounds, soft, mild tenderness in all quadrants MSK: 5/5 strength in upper and lower extremities Neuro: CN II-XII grossly intact, normal sensation throughout.  Psych: appropriate affect and mood   Laboratory: Recent Labs  Lab 01/26/17 1856 01/27/17 0221  WBC 13.3* 10.1  HGB 15.3 13.4  HCT 43.0 38.1*  PLT 233 205   Recent Labs  Lab 01/27/17 0221 01/28/17 0530  01/29/17 0431  NA 137 139 139  K 3.3* 3.9 3.7  CL 105 106 107  CO2 23 27 25   BUN 15 9 9   CREATININE 1.25* 1.19 1.20  CALCIUM 8.7* 9.1 9.0  GLUCOSE 121* 93 92    Imaging/Diagnostic Tests: No results found.  Beaulah DinningGambino, Christina M,  MD 01/29/2017, 7:12 AM [facility] FPTS Intern pager: [phone], text pages welcome

## 2017-01-29 NOTE — Plan of Care (Signed)
  Pain Managment: General experience of comfort will improve 01/29/2017 0113 - Progressing by Luther Redourgott, Farah Lepak, RN   Clinical Measurements: Ability to maintain clinical measurements within normal limits will improve 01/29/2017 0115 - Progressing by Luther Redourgott, Malissia Rabbani, RN

## 2017-01-29 NOTE — Discharge Instructions (Signed)
You were admitted to the hospital for hypertensive urgency (very high blood pressure) and chest pain likely from cocaine use. Your heart enzymes look normal and your echocardiogram did not show any severe heart problems. We have contacted poison control who stated you are in the clear after ingesting the cocaine bag and we do not have to wait for you to pass it here.   Please refer to the resources the social work discussed with you moving forward and refrain from cocaine use.   It was a pleasure caring for you. All the best.

## 2017-01-29 NOTE — Progress Notes (Signed)
BP is maintained to 150/80 during 7am to 7 pm shift, no any complain of SOB and chest pain, scheduled for CT abdomen for anytime this evening, will continue to monior

## 2017-01-30 DIAGNOSIS — Z590 Homelessness: Secondary | ICD-10-CM

## 2017-01-30 MED ORDER — LISINOPRIL 40 MG PO TABS: 40.0000 mg | ORAL_TABLET | Freq: Every day | ORAL | 0 refills | Status: DC

## 2017-01-30 MED ORDER — LISINOPRIL 40 MG PO TABS: 40.0000 mg | ORAL_TABLET | Freq: Every day | ORAL | Status: DC

## 2017-01-30 MED ORDER — ASPIRIN 81 MG PO CHEW: 81.0000 mg | CHEWABLE_TABLET | Freq: Every day | ORAL | 0 refills | Status: AC

## 2017-01-30 MED ORDER — AMLODIPINE BESYLATE 10 MG PO TABS: 10.0000 mg | ORAL_TABLET | Freq: Every day | ORAL | 0 refills | Status: DC

## 2017-01-30 NOTE — Progress Notes (Signed)
Family Medicine Teaching Service Daily Progress Note Intern Pager: [phone]  Patient name: Xavier Norman Medical record number: [id number] Date of birth: Jul 24, 1965 Age: 51 y.o. Gender: male  Primary Care Provider: Patient, No Pcp Per  Consultants: None Code Status: Full  Assessment and Plan: Xavier Norman is a 51 y.o. male presenting with chest pain after cocaine ingestion. PMH is significant for hypertension, cocaine use disorder, detached retina s/p repair on 12/2016  Chest pain, ACS R/O: Resolved. Currently endorsing a headache. ACS has been ruled out. He has been seen by cardiology and was stated no ACS with EKG findings, mild elevated trop at 0.06 trending down. Echo showing normal EF with G1DD. Cocaine induced chest pain is leading differential.  - stop cardiac monitoring - Stop PRN ativan - cardiology - signed off - Tylenol prn for chest pain and toradol prn - GI cocktail PRN - Protonix 40 gm daily  Cocaine ingestion: s/p activated charcoal and miralax on 11/8 to aid in passing bag. Has not passed bag yet. Discussed case with poison control, only have to monitor for 6 hours after per their recommendations. CT enterogram performed in attempt to locate bag, results pending. COW score 0. - GI coctail PRN - PPI as above - Strict I&O's for bag passage - Monitor with COWS  - Miralax to BID  Hypertensive Urgency: Improved. 3 doses of hydralazine over last 24 hours. Can increase ACEI dosage per cards if needed. - continue amlodipine 10 mg - increase lisinopril to 40mg  - hydralazine prn  - avoid betablockers - continue to monitor neuro status  Headache: Present since admission per patient report. No neurological deficits. Unrelieved with Tylenol. Likely from hypertensive urgency - Toradol 30 mg IV q 8 PRN for headache - Tylenol PRN  Numbness in right 4/5 digits: Complains of numbness in his right 4/5th digits since chest pain onset, it is intermittent. No other focal neuro  deficits with 5/5 strength and sensation intact elsewhere. No atrophy noted. 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 Endorses chronic double vision. EOMI. PEERL. Unsure the cause of retinal detachment. Patient endorses it was due to high blood pressure.  - Continue to monitor neuro status, remains stable   FEN/GI: heart healthy diet Prophylaxis: lovenox  Disposition: likely home today  Subjective:  Patient continues to endorse headache. No abdominal pain or any other complaints.  Objective: Temp:  [98.3 F (36.8 C)-98.8 F (37.1 C)] 98.8 F (37.1 C) (11/12 0528) Pulse Rate:  [73-82] 73 (11/12 0528) Resp:  [16-20] 16 (11/12 0528) BP: (150-173)/(80-113) 150/96 (11/12 0528) SpO2:  [98 %-99 %] 99 % (11/12 0528)  Physical Exam: General: NAD, laying in bed, pleasant Cardiovascular: rrr, no mrg Respiratory: CTAB, normal work of breathing Gastrointestinal: normal bowel sounds, soft, mild tenderness in all quadrants Extremities: warm and well perfused Neuro: CN II-XII grossly intact, normal sensation throughout.   Laboratory: Recent Labs  Lab 01/26/17 1856 01/27/17 0221  WBC 13.3* 10.1  HGB 15.3 13.4  HCT 43.0 38.1*  PLT 233 205   Recent Labs  Lab 01/27/17 0221 01/28/17 0530 01/29/17 0431  NA 137 139 139  K 3.3* 3.9 3.7  CL 105 106 107  CO2 23 27 25   BUN 15 9 9   CREATININE 1.25* 1.19 1.20  CALCIUM 8.7* 9.1 9.0  GLUCOSE 121* 93 92  Imaging/Diagnostic Tests: No results found.  Ellwood DenseRumball, Ark Agrusa, DO 01/30/2017, 7:18 AM [facility] FPTS Intern pager: [phone], text pages welcome

## 2017-01-30 NOTE — Progress Notes (Deleted)
Taxi company, [employer] called, stated pt never got in the taxi. AC was notified of pt not getting in the taxi. AC stated pt was discharged, alert and oriented, and independent therefore it is okay.  Nurse tech reported pt requested to visit his vehicle in the ER parking lot prior to getting in the taxi. Nurse tech stated all belongings were placed in the taxi at pt request.     Pt later called stating he could not find his ID card. Documented as pt being admitted with clothing and cell phone. Pt was discharged with all belongings and AVS.

## 2017-01-30 NOTE — Progress Notes (Signed)
Pt discharged with nurse tech. Pt refused wheelchair. [employer] called to pick up pt at emergency entrance requested by pt.

## 2017-01-30 NOTE — Progress Notes (Signed)
Second call made to social work for bus pass for transportation for pt discharge

## 2017-01-30 NOTE — Progress Notes (Signed)
Pt requesting to talk to MD regarding CT results  MD talked to pt via phone  Pt states he has a place to go but no transportation  Called social work for taxi pass, social work stated she can provide a bus pass.

## 2017-01-30 NOTE — Progress Notes (Signed)
[address]50 Taxi company, [employer] called, stated pt never got in the taxi. AC was notified of pt not getting in the taxi. AC stated pt was discharged, alert and oriented, and independent therefore it is okay.  Nurse tech reported pt requested to visit his vehicle in the ER parking lot prior to getting in the taxi. Nurse tech stated all belongings were placed in the taxi at pt request.     1759 Pt later called stating he could not find his ID card. Documented as pt being admitted with clothing and cell phone. Pt was discharged with all belongings and AVS.

## 2017-01-30 NOTE — Progress Notes (Signed)
Pt IV discontinued, catheter intact. Pt discharge teaching provided at bedside. Pt has all belongings. Awaiting bus pass from social work

## 2017-01-30 NOTE — Plan of Care (Signed)
  Health Behavior/Discharge Planning: Ability to manage health-related needs will improve 01/30/2017 0636 - Progressing by Rolm Baptise, RN   Clinical Measurements: Ability to maintain clinical measurements within normal limits will improve 01/29/2017 2102 - Progressing by Rolm Baptise, RN   Clinical Measurements: Will remain free from infection 01/30/2017 0636 - Progressing by Rolm Baptise, RN   Clinical Measurements: Diagnostic test results will improve 01/30/2017 0636 - Progressing by Rolm Baptise, RN Note Patient waiting on CT abd to be read. No BM over night   Clinical Measurements: Respiratory complications will improve 01/30/2017 0636 - Completed/Met by Rolm Baptise, RN   Clinical Measurements: Cardiovascular complication will be avoided 01/30/2017 0636 - Progressing by Rolm Baptise, RN   Nutrition: Adequate nutrition will be maintained 01/30/2017 0636 - Completed/Met by Rolm Baptise, RN   Coping: Level of anxiety will decrease 01/30/2017 0636 - Progressing by Rolm Baptise, RN   Elimination: Will not experience complications related to urinary retention 01/30/2017 0636 - Completed/Met by Rolm Baptise, RN   Pain Managment: General experience of comfort will improve 01/30/2017 0636 - Progressing by Rolm Baptise, RN Toradol for headache q 8 hours 01/29/2017 2102 - Progressing by Karyl Kinnier D, RN   Skin Integrity: Risk for impaired skin integrity will decrease 01/30/2017 0636 - Completed/Met by Rolm Baptise, RN 01/29/2017 2102 - Progressing by Rolm Baptise, RN

## 2017-02-03 ENCOUNTER — Ambulatory Visit: Payer: Self-pay | Admitting: Family Medicine

## 2017-02-08 ENCOUNTER — Ambulatory Visit: Payer: Self-pay | Admitting: Family Medicine

## 2017-02-17 ENCOUNTER — Ambulatory Visit (INDEPENDENT_AMBULATORY_CARE_PROVIDER_SITE_OTHER): Payer: Self-pay | Admitting: Family Medicine

## 2017-02-17 ENCOUNTER — Encounter: Payer: Self-pay | Admitting: Family Medicine

## 2017-02-17 VITALS — BP 162/92 | HR 68 | Temp 98.1°F | Resp 16 | Ht 68.0 in | Wt 218.0 lb

## 2017-02-17 DIAGNOSIS — Z8669 Personal history of other diseases of the nervous system and sense organs: Secondary | ICD-10-CM

## 2017-02-17 DIAGNOSIS — F1411 Cocaine abuse, in remission: Secondary | ICD-10-CM

## 2017-02-17 DIAGNOSIS — Z87898 Personal history of other specified conditions: Secondary | ICD-10-CM

## 2017-02-17 DIAGNOSIS — I10 Essential (primary) hypertension: Secondary | ICD-10-CM

## 2017-02-17 DIAGNOSIS — Z114 Encounter for screening for human immunodeficiency virus [HIV]: Secondary | ICD-10-CM

## 2017-02-17 DIAGNOSIS — N281 Cyst of kidney, acquired: Secondary | ICD-10-CM

## 2017-02-17 LAB — POCT URINALYSIS DIP (DEVICE)
Bilirubin Urine: NEGATIVE
Glucose, UA: NEGATIVE mg/dL
HGB URINE DIPSTICK: NEGATIVE
Ketones, ur: NEGATIVE mg/dL
Leukocytes, UA: NEGATIVE
NITRITE: NEGATIVE
PH: 5.5 (ref 5.0–8.0)
Protein, ur: NEGATIVE mg/dL
Specific Gravity, Urine: 1.02 (ref 1.005–1.030)
UROBILINOGEN UA: 0.2 mg/dL (ref 0.0–1.0)

## 2017-02-17 MED ORDER — CLONIDINE HCL 0.1 MG PO TABS
0.1000 mg | ORAL_TABLET | Freq: Once | ORAL | Status: AC
  Administered 2017-02-17: 0.1 mg via ORAL

## 2017-02-17 MED ORDER — LISINOPRIL 40 MG PO TABS: 40.0000 mg | ORAL_TABLET | Freq: Every day | ORAL | 5 refills | Status: AC

## 2017-02-17 MED ORDER — AMLODIPINE BESYLATE 10 MG PO TABS: 10.0000 mg | ORAL_TABLET | Freq: Every day | ORAL | 5 refills | Status: AC

## 2017-02-17 MED FILL — AMLODIPINE BESYLATE 10 MG T: 10 | 30 days supply | Qty: 30 | Fill #0

## 2017-02-17 MED FILL — LISINOPRIL 40 MG TAB: 40 | 30 days supply | Qty: 30 | Fill #0

## 2017-02-17 NOTE — Patient Instructions (Addendum)
[facility] [phone] [address] [place], [place]    Sent lisinopril 40 mg and amlodipine 10 mg daily to [facility].  Return to clinic in 1 week for blood pressure check.  - Continue medication, monitor blood pressure at home. Continue DASH diet. Reminder to go to the ER if any CP, SOB, nausea, dizziness, severe HA, changes vision/speech, left arm numbness and tingling and jaw pain.   Also recommend the patient complete financial assistance forms.  Due to history of sleep apnea, sent a referral for a split-night sleep study.    DASH Eating Plan DASH stands for "Dietary Approaches to Stop Hypertension." The DASH eating plan is a healthy eating plan that has been shown to reduce high blood pressure (hypertension). It may also reduce your risk for type 2 diabetes, heart disease, and stroke. The DASH eating plan may also help with weight loss. What are tips for following this plan? General guidelines  Avoid eating more than 2,300 mg (milligrams) of salt (sodium) a day. If you have hypertension, you may need to reduce your sodium intake to 1,500 mg a day.  Limit alcohol intake to no more than 1 drink a day for nonpregnant women and 2 drinks a day for men. One drink equals 12 oz of beer, 5 oz of wine, or 1 oz of hard liquor.  Work with your health care provider to maintain a healthy body weight or to lose weight. Ask what an ideal weight is for you.  Get at least 30 minutes of exercise that causes your heart to beat faster (aerobic exercise) most days of the week. Activities may include walking, swimming, or biking.  Work with your health care provider or diet and nutrition specialist (dietitian) to adjust your eating plan to your individual calorie needs. Reading food labels  Check food labels for the amount of sodium per serving. Choose foods with less than 5 percent of the Daily Value of sodium. Generally, foods with less than 300 mg of  sodium per serving fit into this eating plan.  To find whole grains, look for the word "whole" as the first word in the ingredient list. Shopping  Buy products labeled as "low-sodium" or "no salt added."  Buy fresh foods. Avoid canned foods and premade or frozen meals. Cooking  Avoid adding salt when cooking. Use salt-free seasonings or herbs instead of table salt or sea salt. Check with your health care provider or pharmacist before using salt substitutes.  Do not fry foods. Cook foods using healthy methods such as baking, boiling, grilling, and broiling instead.  Cook with heart-healthy oils, such as olive, canola, soybean, or sunflower oil. Meal planning   Eat a balanced diet that includes: ? 5 or more servings of fruits and vegetables each day. At each meal, try to fill half of your plate with fruits and vegetables. ? Up to 6-8 servings of whole grains each day. ? Less than 6 oz of lean meat, poultry, or fish each day. A 3-oz serving of meat is about the same size as a deck of cards. One egg equals 1 oz. ? 2 servings of low-fat dairy each day. ? A serving of nuts, seeds, or beans 5 times each week. ? Heart-healthy fats. Healthy fats called Omega-3 fatty acids are found in foods such as flaxseeds and coldwater fish, like sardines, salmon, and mackerel.  Limit how much you eat of the following: ? Canned or prepackaged foods. ? Food that is high  in trans fat, such as fried foods. ? Food that is high in saturated fat, such as fatty meat. ? Sweets, desserts, sugary drinks, and other foods with added sugar. ? Full-fat dairy products.  Do not salt foods before eating.  Try to eat at least 2 vegetarian meals each week.  Eat more home-cooked food and less restaurant, buffet, and fast food.  When eating at a restaurant, ask that your food be prepared with less salt or no salt, if possible. What foods are recommended? The items listed may not be a complete list. Talk with your  dietitian about what dietary choices are best for you. Grains Whole-grain or whole-wheat bread. Whole-grain or whole-wheat pasta. Brown rice. Orpah Cobbatmeal. Quinoa. Bulgur. Whole-grain and low-sodium cereals. Pita bread. Low-fat, low-sodium crackers. Whole-wheat flour tortillas. Vegetables Fresh or frozen vegetables (raw, steamed, roasted, or grilled). Low-sodium or reduced-sodium tomato and vegetable juice. Low-sodium or reduced-sodium tomato sauce and tomato paste. Low-sodium or reduced-sodium canned vegetables. Fruits All fresh, dried, or frozen fruit. Canned fruit in natural juice (without added sugar). Meat and other protein foods Skinless chicken or Malawiturkey. Ground chicken or Mal[place]urkey. Pork with fat trimmed off. Fish and seafood. Egg whites. Dried beans, peas, or lentils. Unsalted nuts, nut butters, and seeds. Unsalted canned beans. Lean cuts of beef with fat trimmed off. Low-sodium, lean deli meat. Dairy Low-fat (1%) or fat-free (skim) milk. Fat-free, low-fat, or reduced-fat cheeses. Nonfat, low-sodium ricotta or cottage cheese. Low-fat or nonfat yogurt. Low-fat, low-sodium cheese. Fats and oils Soft margarine without trans fats. Vegetable oil. Low-fat, reduced-fat, or light mayonnaise and salad dressings (reduced-sodium). Canola, safflower, olive, soybean, and sunflower oils. Avocado. Seasoning and other foods Herbs. Spices. Seasoning mixes without salt. Unsalted popcorn and pretzels. Fat-free sweets. What foods are not recommended? The items listed may not be a complete list. Talk with your dietitian about what dietary choices are best for you. Grains Baked goods made with fat, such as croissants, muffins, or some breads. Dry pasta or rice meal packs. Vegetables Creamed or fried vegetables. Vegetables in a cheese sauce. Regular canned vegetables (not low-sodium or reduced-sodium). Regular canned tomato sauce and paste (not low-sodium or reduced-sodium). Regular tomato and vegetable juice (not  low-sodium or reduced-sodium). Rosita FirePickles. Olives. Fruits Canned fruit in a light or heavy syrup. Fried fruit. Fruit in cream or butter sauce. Meat and other protein foods Fatty cuts of meat. Ribs. Fried meat. Tomasa BlaseBacon. Sausage. Bologna and other processed lunch meats. Salami. Fatback. Hotdogs. Bratwurst. Salted nuts and seeds. Canned beans with added salt. Canned or smoked fish. Whole eggs or egg yolks. Chicken or Malawiturkey with skin. Dairy Whole or 2% milk, cream, and half-and-half. Whole or full-fat cream cheese. Whole-fat or sweetened yogurt. Full-fat cheese. Nondairy creamers. Whipped toppings. Processed cheese and cheese spreads. Fats and oils Butter. Stick margarine. Lard. Shortening. Ghee. Bacon fat. Tropical oils, such as coconut, palm kernel, or palm oil. Seasoning and other foods Salted popcorn and pretzels. Onion salt, garlic salt, seasoned salt, table salt, and sea salt. Worcestershire sauce. Tartar sauce. Barbecue sauce. Teriyaki sauce. Soy sauce, including reduced-sodium. Steak sauce. Canned and packaged gravies. Fish sauce. Oyster sauce. Cocktail sauce. Horseradish that you find on the shelf. Ketchup. Mustard. Meat flavorings and tenderizers. Bouillon cubes. Hot sauce and Tabasco sauce. Premade or packaged marinades. Premade or packaged taco seasonings. Relishes. Regular salad dressings. Where to find more information:  National Heart, Lung, and Blood Institute: PopSteam.iswww.nhlbi.nih.gov  American Heart Association: www.heart.org Summary  The DASH eating plan is a healthy eating plan  that has been shown to reduce high blood pressure (hypertension). It may also reduce your risk for type 2 diabetes, heart disease, and stroke.  With the DASH eating plan, you should limit salt (sodium) intake to 2,300 mg a day. If you have hypertension, you may need to reduce your sodium intake to 1,500 mg a day.  When on the DASH eating plan, aim to eat more fresh fruits and vegetables, whole grains, lean proteins,  low-fat dairy, and heart-healthy fats.  Work with your health care provider or diet and nutrition specialist (dietitian) to adjust your eating plan to your individual calorie needs. This information is not intended to replace advice given to you by your health care provider. Make sure you discuss any questions you have with your health care provider. Document Released: 02/24/2011 Document Revised: 02/29/2016 Document Reviewed: 02/29/2016 Elsevier Interactive Patient Education  2017 [employer]

## 2017-02-17 NOTE — Progress Notes (Signed)
Subjective:    Patient ID: Xavier Norman, male    DOB: 1965/04/29, 51 y.o.   MRN: [id number]  HPI Xavier Norman, a 51 year old male with a history of uncontrolled hypertension presents to establish care.  Patient has not had a primary provider due to financial and insurance constraints.  He was recently admitted to inpatient services on 01/30/2017 with poorly controlled hypertension and cocaine abuse.  Patient reports that he swallowed a balloon containing 5 g of cocaine prior to admission.  Poison control was consulted and no further workup was recommended at the time.  Patient was monitored closely by inpatient services.  Cardiac workup was negative.  All antihypertensive medications were restarted prior to discharge.  Patient did not pick up prescriptions from pharmacy due to financial constraints.  He states that he could not afford medications and has not been taking antihypertensive medication since discharge.  Patient's blood pressure was markedly elevated on arrival.  He currently denies dizziness, headache, chest pain, nausea, vomiting, diarrhea, or swelling to bilateral lower extremity.  Patient has not been following a low-sodium, low-fat diet or exercising routinely.  He states that he is not used cocaine since hospital admission.  Patient is not currently in a drug treatment program.   Past Medical History:  Diagnosis Date  . Hypertension   . Sleep apnea    Social History   Socioeconomic History  . Marital status: Single    Spouse name: Not on file  . Number of children: Not on file  . Years of education: Not on file  . Highest education level: Not on file  Social Needs  . Financial resource strain: Not on file  . Food insecurity - worry: Not on file  . Food insecurity - inability: Not on file  . Transportation needs - medical: Not on file  . Transportation needs - non-medical: Not on file  Occupational History  . Not on file  Tobacco Use  . Smoking status: Never Smoker   . Smokeless tobacco: Never Used  Substance and Sexual Activity  . Alcohol use: No  . Drug use: No  . Sexual activity: Not on file  Other Topics Concern  . Not on file  Social History Narrative  . Not on file   Immunization History  Administered Date(s) Administered  . Tdap 03/23/2016   No Known Allergies  Review of Systems  Constitutional: Negative.   HENT: Negative.   Eyes: Negative.   Respiratory: Positive for apnea. Negative for chest tightness and shortness of breath.   Cardiovascular: Negative.  Negative for chest pain, palpitations and leg swelling.  Gastrointestinal: Negative.   Endocrine: Negative.   Genitourinary: Negative.   Musculoskeletal: Negative.   Allergic/Immunologic: Negative.   Neurological: Negative.  Negative for dizziness, facial asymmetry and headaches.  Hematological: Negative.        Objective:   Physical Exam  Constitutional: He is oriented to person, place, and time. He appears well-developed and well-nourished.  HENT:  Head: Normocephalic and atraumatic.  Right Ear: External ear normal.  Left Ear: External ear normal.  Nose: Nose normal.  Mouth/Throat: Oropharynx is clear and moist.  Eyes: Conjunctivae and EOM are normal. Pupils are equal, round, and reactive to light.  Neck: Normal range of motion. Neck supple.  Cardiovascular: Normal rate, regular rhythm and normal heart sounds.  No murmur heard. Pulmonary/Chest: Effort normal and breath sounds normal.  Abdominal: Soft. Bowel sounds are normal.  Musculoskeletal: Normal range of motion.  Neurological: He is alert and oriented  to person, place, and time. He has normal reflexes.  Skin: Skin is warm and dry.  Psychiatric: He has a normal mood and affect. His behavior is normal. Judgment and thought content normal.         BP (!) 162/92 Comment: manually  Pulse 68   Temp 98.1 F (36.7 C) (Oral)   Resp 16   Ht 5\' 8"  (1.727 m)   Wt 218 lb (98.9 kg)   SpO2 100%   BMI 33.15 kg/m   Assessment & Plan:  1. Hypertension, unspecified type Blood pressure decreased to 162/92.  Lisinopril 40 mg and amlodipine 10 mg daily were both sent to [facility].  Patient was also advised to complete financial assistance forms.  Discussed the importance of medication compliance in order to achieve positive outcomes.  Will review renal functioning as results become available - cloNIDine (CATAPRES) tablet 0.1 mg - amLODipine (NORVASC) 10 MG tablet; Take 1 tablet (10 mg total) by mouth daily.  Dispense: 30 tablet; Refill: 5 - lisinopril (PRINIVIL,ZESTRIL) 40 MG tablet; Take 1 tablet (40 mg total) by mouth daily.  Dispense: 30 tablet; Refill: 5 - BASIC METABOLIC PANEL WITH GFR - POCT urinalysis dip (device)  2. Renal cyst, acquired, right An incidental finding of a 1.3 cm complex renal cyst in the lower right kidney with mild thickening internal septations with perceived septal enhancement was visualized on an CT of the abdomen and pelvis on 01/29/2017.  It is recommended that a follow-up MRI of the abdomen without and with IV contrast be obtained in 6 months.  3. Screening for HIV (human immunodeficiency virus)  - HIV antibody (with reflex)  4. History of cocaine abuse Patient states that he is not utilize cocaine since prior to hospital admission on 01/29/2017.  Patient was also given information for outpatient follow[place].  Patient has a history of sleep apnea  5. History of sleep apnea .  He states that he is not been able to use CPAP due to a hole in tubing.  He received CPAP while incarcerated, so there has not been any monitoring of CPAP over the past several years.  I recommend that patient complete financial assistance forms and will send a referral for split-night sleep study. - Split night study; Future    RTC: One week for blood pressure check.   follow-up in 1 month for hypertension     Nolon NationsLaChina Moore Jodey Burbano  MSN,  FNP-C Patient Care [facility] [address]  [place], [place] [id number] [phone]

## 2017-02-18 LAB — BASIC METABOLIC PANEL WITH GFR
BUN: 15 mg/dL (ref 7–25)
CHLORIDE: 103 mmol/L (ref 98–110)
CO2: 25 mmol/L (ref 20–32)
Calcium: 9.5 mg/dL (ref 8.6–10.3)
Creat: 1.18 mg/dL (ref 0.70–1.33)
GFR, EST AFRICAN AMERICAN: 83 mL/min/{1.73_m2} (ref >=60)
GFR, EST NON AFRICAN AMERICAN: 72 mL/min/{1.73_m2} (ref >=60)
Glucose, Bld: 94 mg/dL (ref 65–99)
POTASSIUM: 4.3 mmol/L (ref 3.5–5.3)
Sodium: 138 mmol/L (ref 135–146)

## 2017-02-18 LAB — HIV ANTIBODY (ROUTINE TESTING W REFLEX): HIV 1&2 Ab, 4th Generation: NONREACTIVE

## 2017-02-24 ENCOUNTER — Other Ambulatory Visit: Payer: Self-pay | Admitting: Family Medicine

## 2017-02-24 ENCOUNTER — Ambulatory Visit: Payer: Self-pay | Admitting: Family Medicine

## 2017-02-24 ENCOUNTER — Ambulatory Visit: Payer: Self-pay

## 2017-02-24 VITALS — BP 132/56

## 2017-02-24 DIAGNOSIS — I10 Essential (primary) hypertension: Secondary | ICD-10-CM

## 2017-03-06 ENCOUNTER — Telehealth: Payer: Self-pay | Admitting: Family Medicine

## 2017-03-06 NOTE — Telephone Encounter (Addendum)
Patient dropped of health form from [facility] to be completed by provider so that he can continue to get it done. Form left with CMA for completion. Patient made aware of form policy and verbalized understanding. Please advise.

## 2017-03-06 NOTE — Telephone Encounter (Signed)
Armeniahina, I have filled out bp from last two encounters and have place this form on your desk for signature. Thanks!

## 2017-03-27 ENCOUNTER — Ambulatory Visit: Payer: Self-pay | Admitting: Family Medicine

## 2018-01-06 IMAGING — CT CT HEAD W/O CM
3 of 4 series · 16 of 47 positions shown, 19 images · non-contrast
Comparison: 05/28/2016

CLINICAL DATA: Hypertension and blurry vision

EXAM:
CT HEAD WITHOUT CONTRAST
TECHNIQUE: Contiguous axial images were obtained from the base of the skull
through the vertex without intravenous contrast.

[Series 2: head w/o · axial · non-contrast · 0.45mm/px · z∈[-144,-24]mm · 10 of 30 slices shown, 13 images]
[im 3/30  brain]
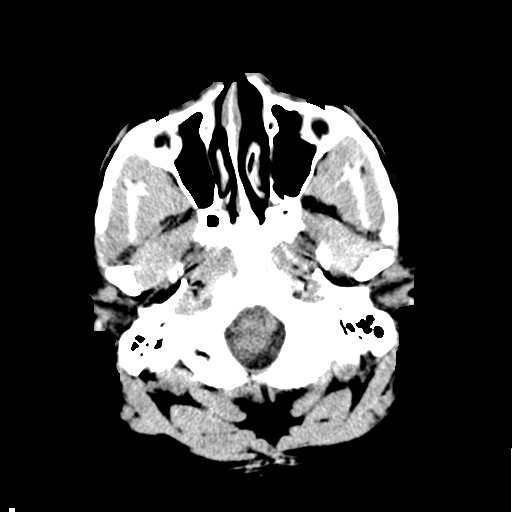
[im 3/30  bone]
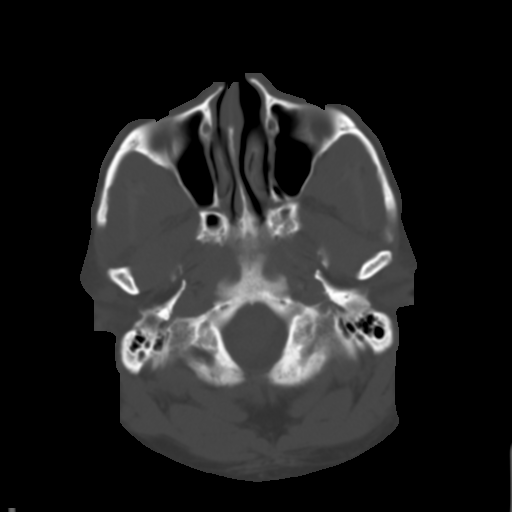
[im 5/30  brain]
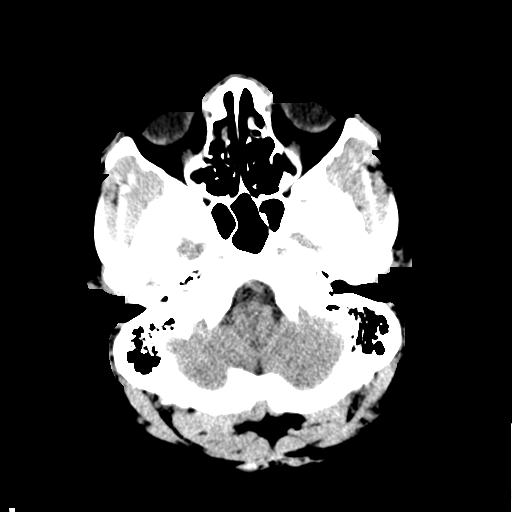
[im 9/30  brain]
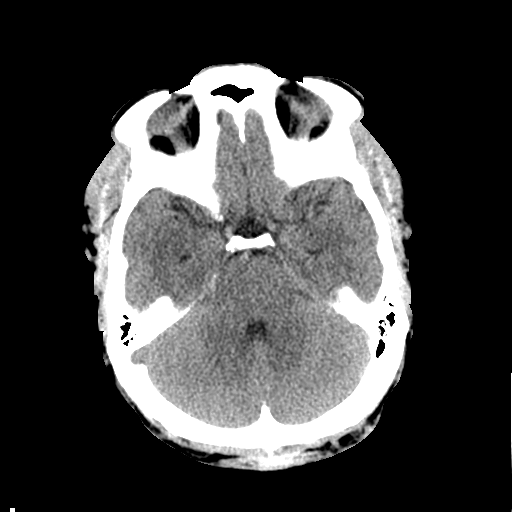
[im 11/30  brain]
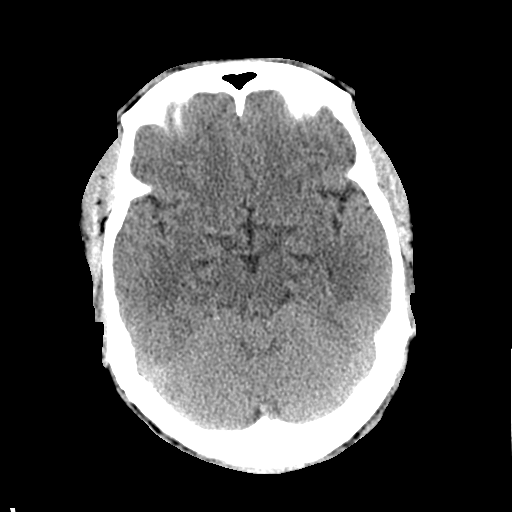
[im 13/30  brain]
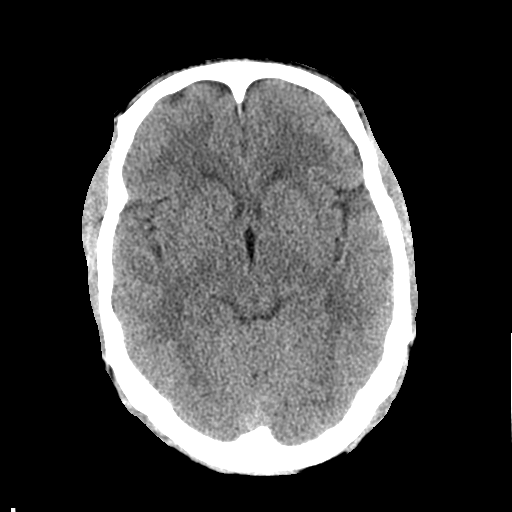
[im 13/30  bone]
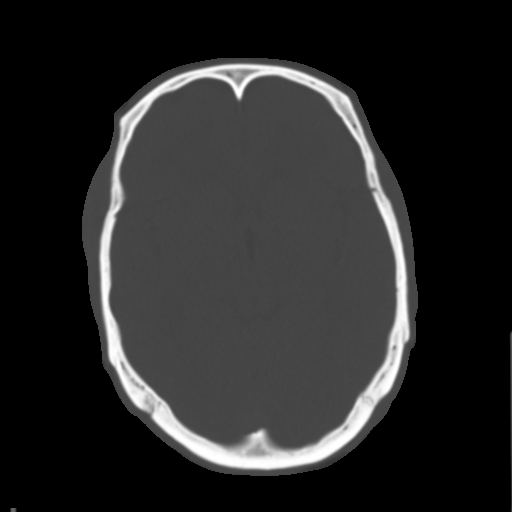
[im 17/30  brain]
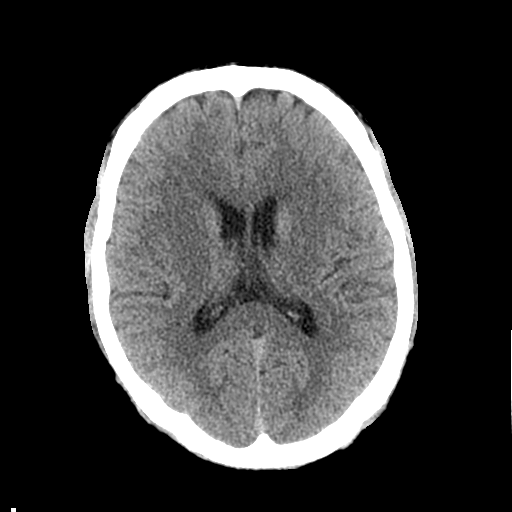
[im 19/30  brain]
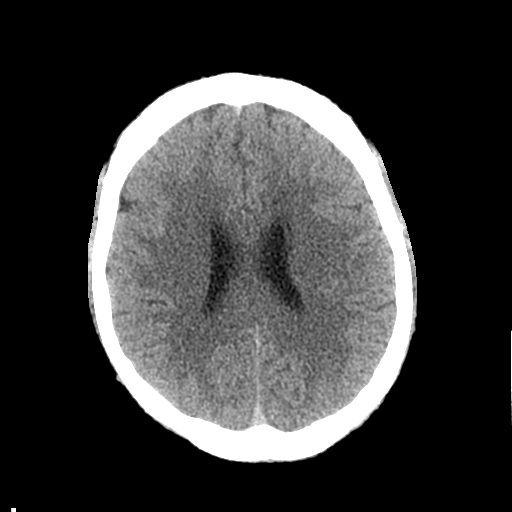
[im 21/30  brain]
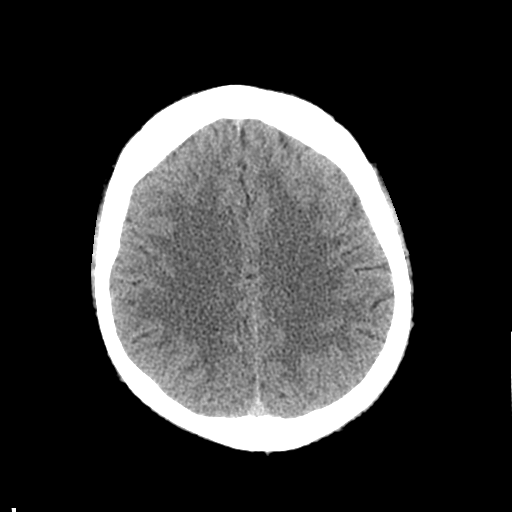
[im 25/30  brain]
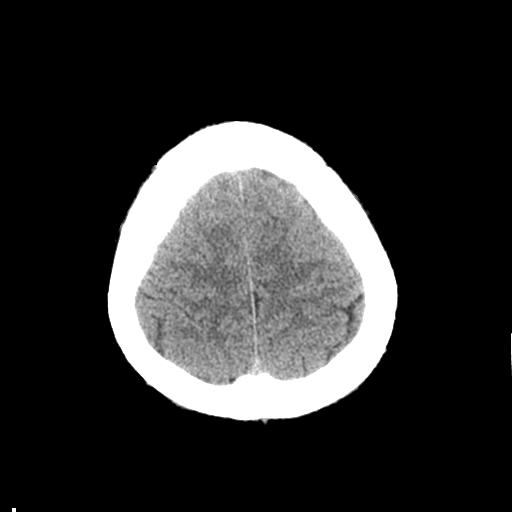
[im 25/30  bone]
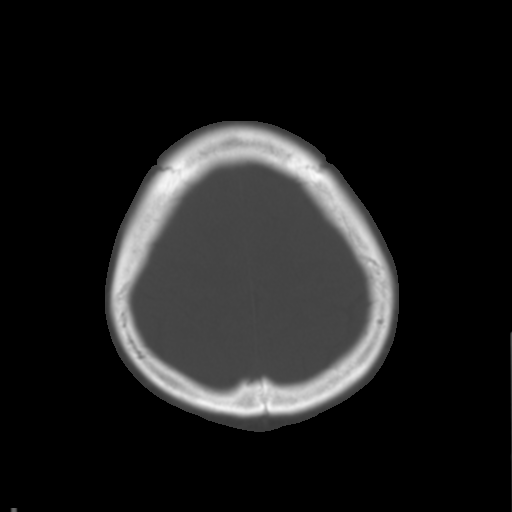
[im 27/30  brain]
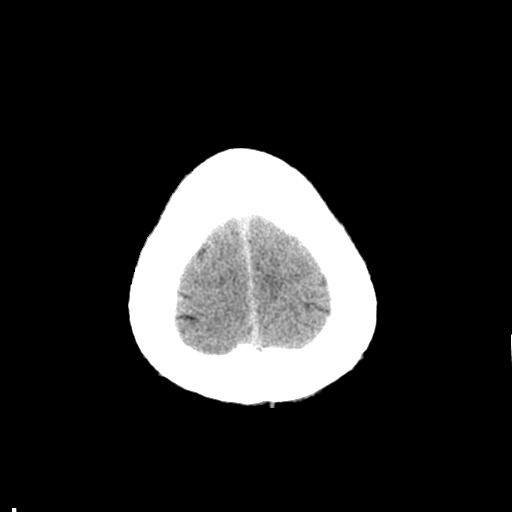

[Series 5: coronal · coronal · 0.30mm/px · 3 of 62 slices shown]
[im 21/62  brain]
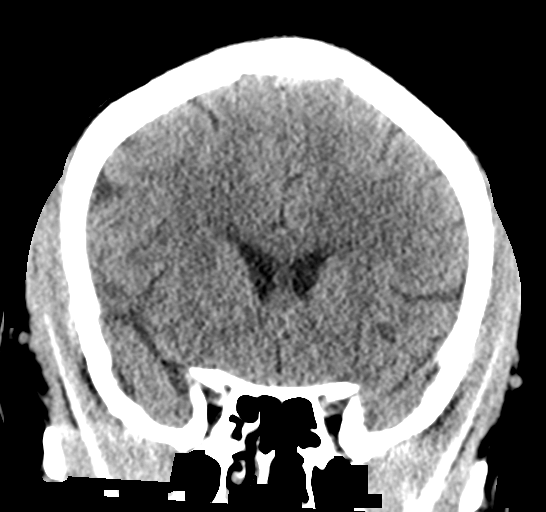
[im 28/62  brain]
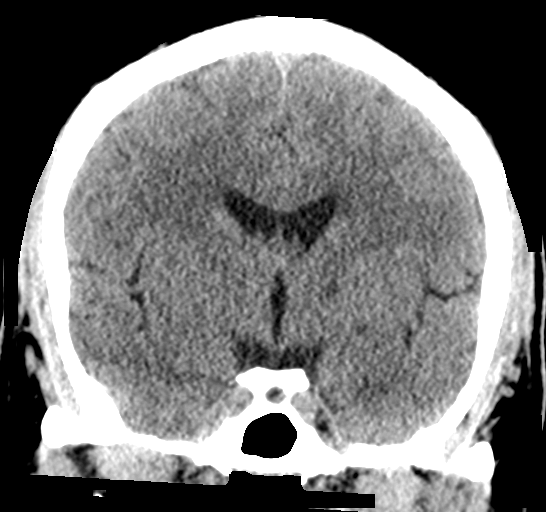
[im 34/62  brain]
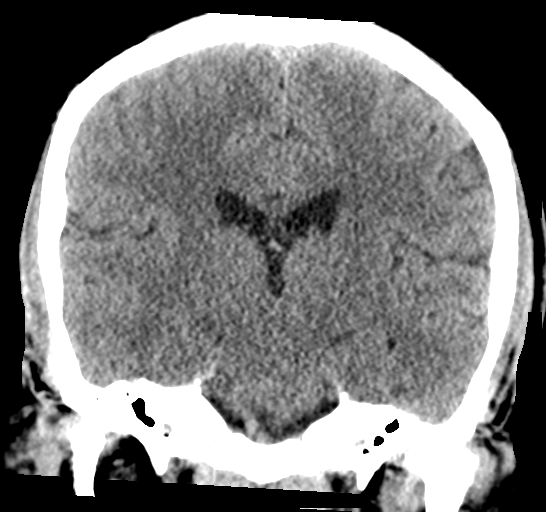

[Series 6: sagittal · sagittal · 0.30mm/px · 3 of 49 slices shown]
[im 17/49  brain]
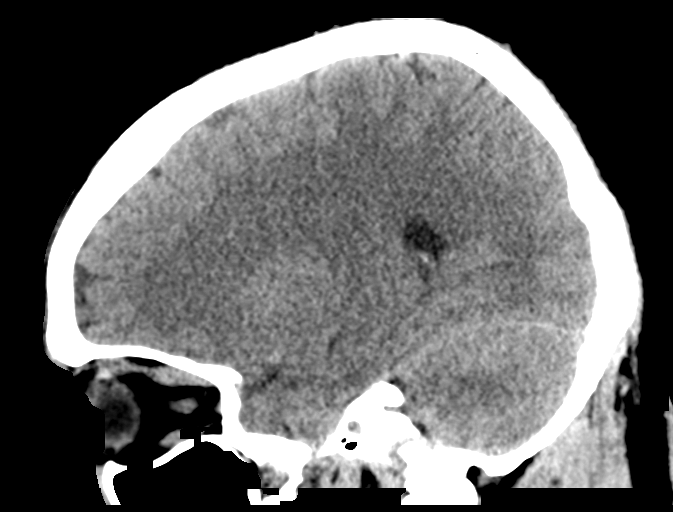
[im 25/49  brain]
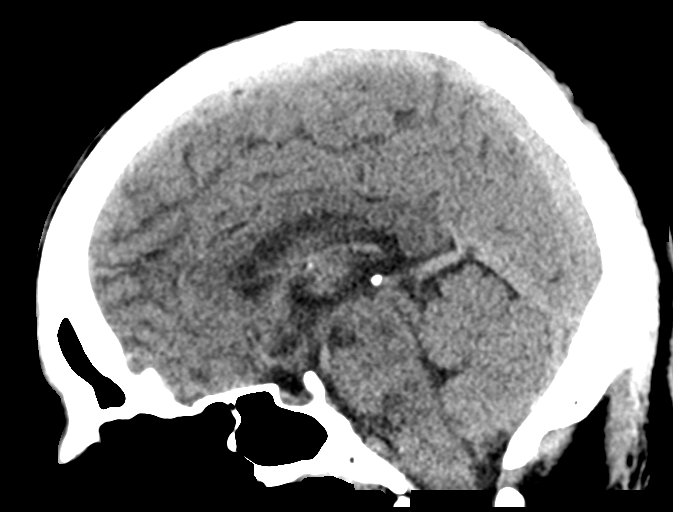
[im 33/49  brain]
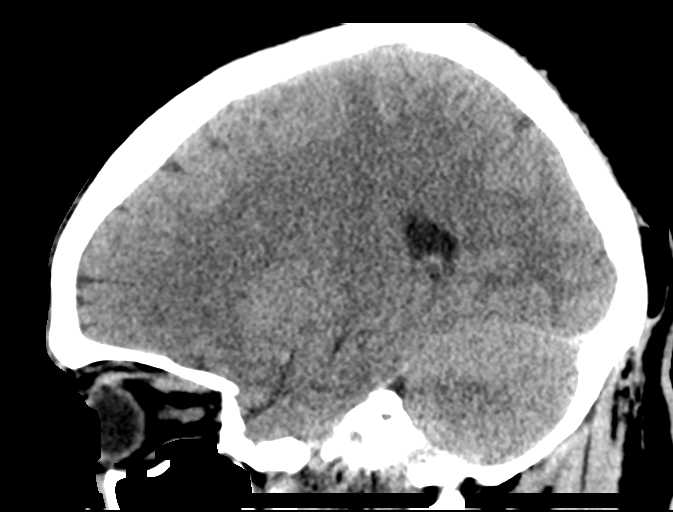

[16 of 47 positions shown; findings below may reference images not displayed]

FINDINGS: Brain: No evidence of acute infarction, hemorrhage, hydrocephalus,
extra-axial collection or mass lesion/mass effect.

Vascular: No hyperdense vessel or unexpected calcification.

Skull: Normal. Negative for fracture or focal lesion.

Sinuses/Orbits: No acute finding.

Other: None.
IMPRESSION: No acute intracranial abnormality noted.

## 2018-01-06 IMAGING — CR DG CHEST 2V
2 series · 2 of 2 positions shown · non-contrast
Comparison: 07/31/2015

CLINICAL DATA: Productive cough and fever

EXAM:
CHEST  2 VIEW

[w chest pa]
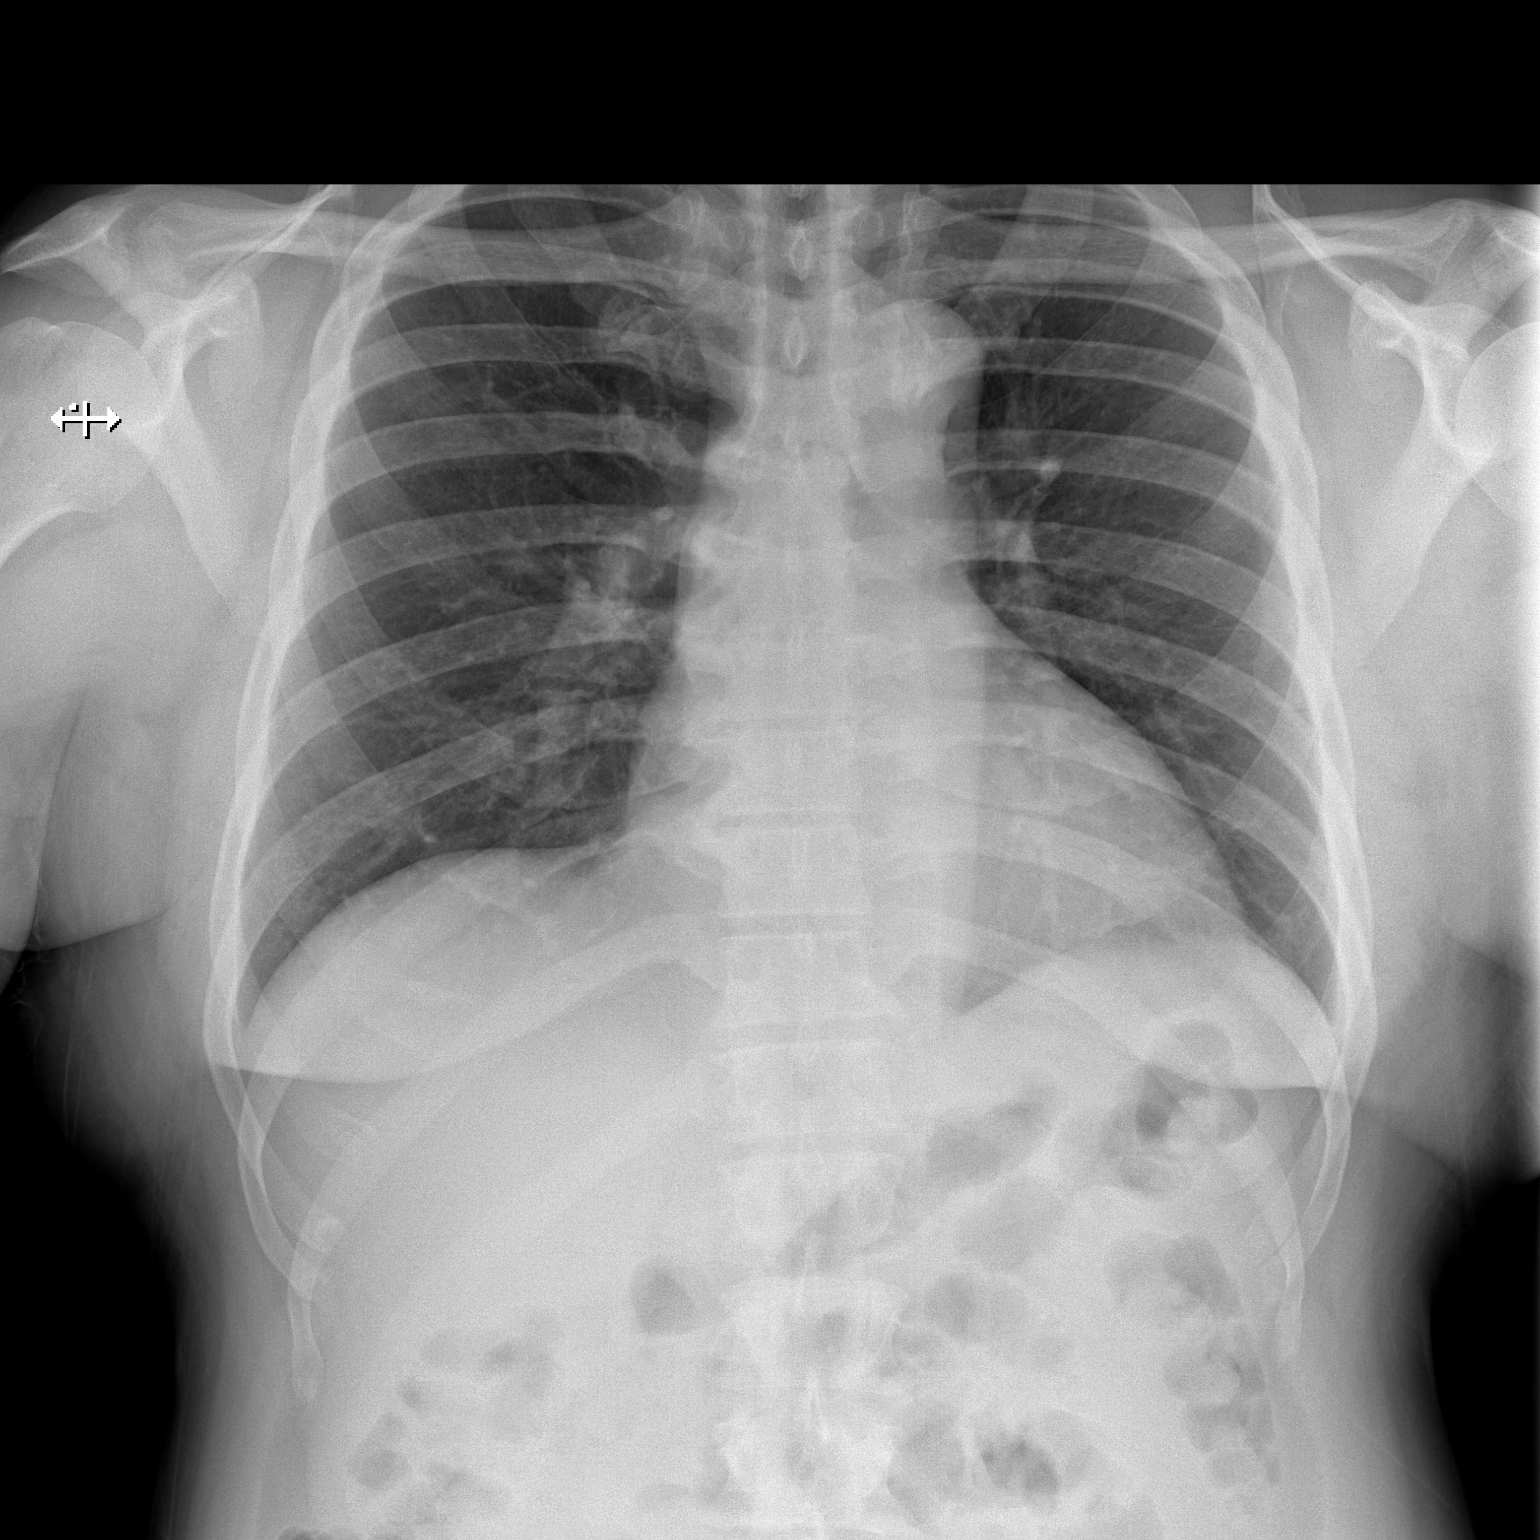

[w chest lat]
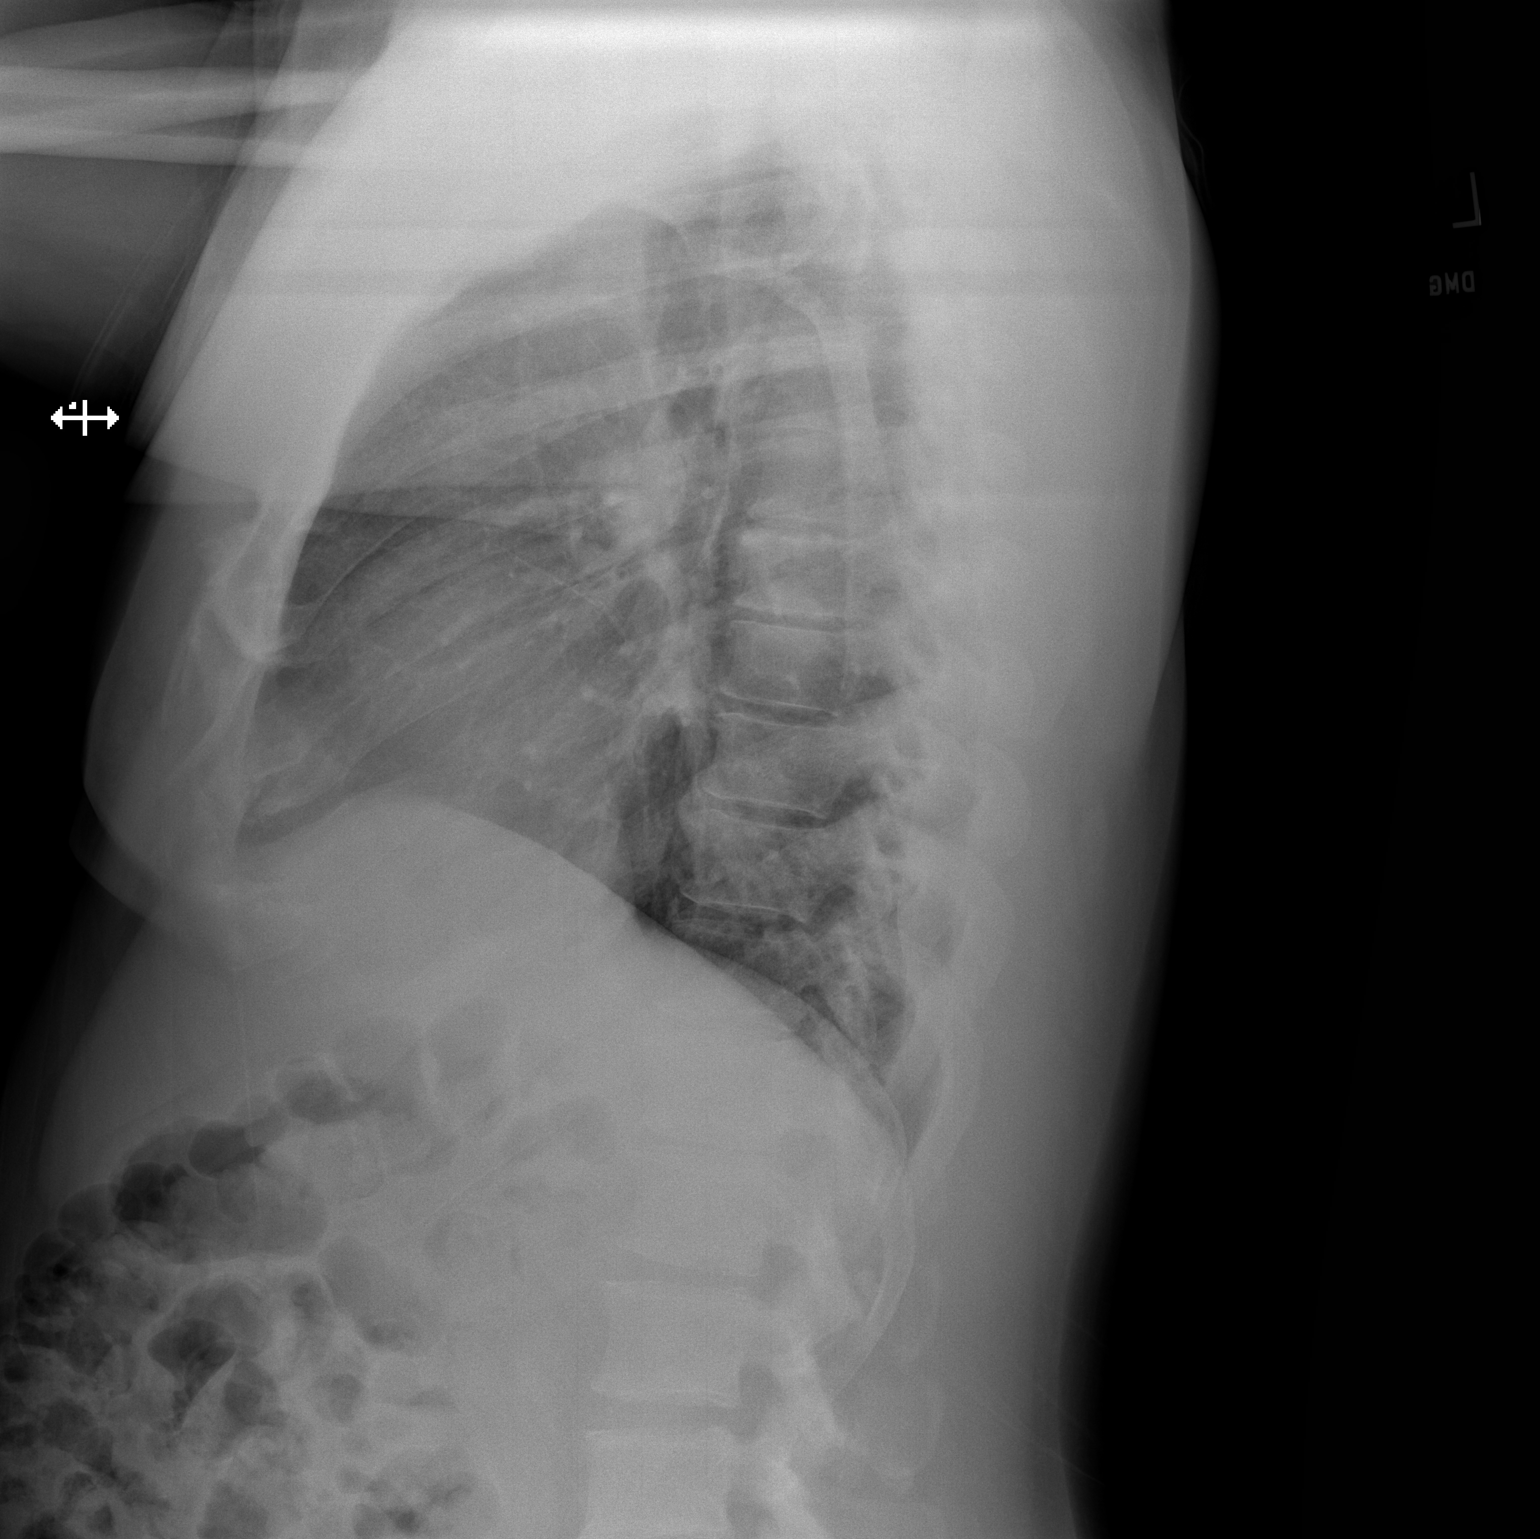

[2 of 2 positions shown; findings below may reference images not displayed]

FINDINGS: The heart size and mediastinal contours are within normal limits.
Both lungs are clear. The visualized skeletal structures are
unremarkable.
IMPRESSION: No active cardiopulmonary disease.
